# Patient Record
Sex: Female | Born: 1970 | Race: White | Hispanic: No | Smoking: Never smoker
Health system: Southern US, Community
[De-identification: ages and names within clinical notes are randomized; demographics above are authoritative.]

## PROBLEM LIST (undated history)

## (undated) DIAGNOSIS — J45909 Unspecified asthma, uncomplicated: Secondary | ICD-10-CM

## (undated) DIAGNOSIS — M1990 Unspecified osteoarthritis, unspecified site: Secondary | ICD-10-CM

## (undated) DIAGNOSIS — F32A Depression, unspecified: Secondary | ICD-10-CM

## (undated) DIAGNOSIS — I10 Essential (primary) hypertension: Secondary | ICD-10-CM

## (undated) DIAGNOSIS — I509 Heart failure, unspecified: Secondary | ICD-10-CM

## (undated) DIAGNOSIS — I499 Cardiac arrhythmia, unspecified: Secondary | ICD-10-CM

## (undated) DIAGNOSIS — E7800 Pure hypercholesterolemia, unspecified: Secondary | ICD-10-CM

## (undated) DIAGNOSIS — R569 Unspecified convulsions: Secondary | ICD-10-CM

## (undated) DIAGNOSIS — E119 Type 2 diabetes mellitus without complications: Secondary | ICD-10-CM

## (undated) DIAGNOSIS — K219 Gastro-esophageal reflux disease without esophagitis: Secondary | ICD-10-CM

## (undated) DIAGNOSIS — G4730 Sleep apnea, unspecified: Secondary | ICD-10-CM

## (undated) DIAGNOSIS — F329 Major depressive disorder, single episode, unspecified: Secondary | ICD-10-CM

## (undated) DIAGNOSIS — F419 Anxiety disorder, unspecified: Secondary | ICD-10-CM

## (undated) HISTORY — PX: ABDOMINAL SURGERY: SHX537

## (undated) HISTORY — DX: Anxiety disorder, unspecified: F41.9

## (undated) HISTORY — PX: CHOLECYSTECTOMY: SHX55

## (undated) HISTORY — DX: Cardiac arrhythmia, unspecified: I49.9

## (undated) HISTORY — DX: Type 2 diabetes mellitus without complications: E11.9

## (undated) HISTORY — PX: TYMPANOSTOMY: SHX2586

## (undated) HISTORY — PX: FOOT SURGERY: SHX648

---

## 2003-05-22 ENCOUNTER — Other Ambulatory Visit: Payer: Self-pay

## 2004-05-14 ENCOUNTER — Emergency Department: Payer: Self-pay | Admitting: Emergency Medicine

## 2004-05-15 ENCOUNTER — Emergency Department: Payer: Self-pay | Admitting: General Practice

## 2004-08-03 ENCOUNTER — Observation Stay: Payer: Self-pay | Admitting: Certified Nurse Midwife

## 2004-08-11 ENCOUNTER — Observation Stay: Payer: Self-pay

## 2004-08-19 ENCOUNTER — Observation Stay: Payer: Self-pay

## 2004-08-23 ENCOUNTER — Observation Stay: Payer: Self-pay

## 2004-08-30 ENCOUNTER — Observation Stay: Payer: Self-pay | Admitting: Obstetrics and Gynecology

## 2004-09-06 ENCOUNTER — Observation Stay: Payer: Self-pay | Admitting: Obstetrics and Gynecology

## 2004-09-13 ENCOUNTER — Observation Stay: Payer: Self-pay | Admitting: Obstetrics and Gynecology

## 2004-09-20 ENCOUNTER — Observation Stay: Payer: Self-pay | Admitting: Obstetrics and Gynecology

## 2004-09-27 ENCOUNTER — Observation Stay: Payer: Self-pay

## 2004-10-01 ENCOUNTER — Observation Stay: Payer: Self-pay | Admitting: Obstetrics and Gynecology

## 2004-10-04 ENCOUNTER — Inpatient Hospital Stay: Payer: Self-pay | Admitting: Obstetrics and Gynecology

## 2006-02-20 DIAGNOSIS — F32A Depression, unspecified: Secondary | ICD-10-CM | POA: Insufficient documentation

## 2006-08-12 ENCOUNTER — Emergency Department: Payer: Self-pay | Admitting: Emergency Medicine

## 2006-10-11 DIAGNOSIS — I10 Essential (primary) hypertension: Secondary | ICD-10-CM | POA: Insufficient documentation

## 2007-09-27 ENCOUNTER — Emergency Department: Payer: Self-pay | Admitting: Unknown Physician Specialty

## 2008-09-16 ENCOUNTER — Emergency Department: Payer: Self-pay | Admitting: Internal Medicine

## 2009-09-01 DIAGNOSIS — G8929 Other chronic pain: Secondary | ICD-10-CM | POA: Insufficient documentation

## 2009-09-01 DIAGNOSIS — G894 Chronic pain syndrome: Secondary | ICD-10-CM | POA: Insufficient documentation

## 2009-09-01 DIAGNOSIS — E669 Obesity, unspecified: Secondary | ICD-10-CM | POA: Insufficient documentation

## 2012-08-05 ENCOUNTER — Emergency Department: Payer: Self-pay | Admitting: Emergency Medicine

## 2012-08-08 LAB — BETA STREP CULTURE(ARMC)

## 2013-01-13 ENCOUNTER — Observation Stay: Payer: Self-pay | Admitting: Internal Medicine

## 2013-01-13 LAB — CBC
HCT: 36.9 % (ref 35.0–47.0)
HGB: 12.9 g/dL (ref 12.0–16.0)
MCH: 32.5 pg (ref 26.0–34.0)
MCHC: 35 g/dL (ref 32.0–36.0)
MCV: 93 fL (ref 80–100)
Platelet: 220 10*3/uL (ref 150–440)
RBC: 3.97 10*6/uL (ref 3.80–5.20)
RDW: 12.7 % (ref 11.5–14.5)
WBC: 7 10*3/uL (ref 3.6–11.0)

## 2013-01-13 LAB — TROPONIN I
Troponin-I: <0.02 ng/mL
Troponin-I: <0.02 ng/mL
Troponin-I: <0.02 ng/mL

## 2013-01-13 LAB — BASIC METABOLIC PANEL
Anion Gap: 5 — ABNORMAL LOW (ref 7–16)
BUN: 15 mg/dL (ref 7–18)
Calcium, Total: 8.5 mg/dL (ref 8.5–10.1)
Chloride: 105 mmol/L (ref 98–107)
Co2: 28 mmol/L (ref 21–32)
Creatinine: 0.86 mg/dL (ref 0.60–1.30)
EGFR (African American): >60
EGFR (Non-African Amer.): >60
Glucose: 108 mg/dL — ABNORMAL HIGH (ref 65–99)
Osmolality: 277 (ref 275–301)
Potassium: 4 mmol/L (ref 3.5–5.1)
Sodium: 138 mmol/L (ref 136–145)

## 2013-01-13 LAB — LIPID PANEL
Cholesterol: 149 mg/dL (ref 0–200)
HDL Cholesterol: 35 mg/dL — ABNORMAL LOW (ref 40–60)
Ldl Cholesterol, Calc: 82 mg/dL (ref 0–100)
Triglycerides: 162 mg/dL (ref 0–200)
VLDL Cholesterol, Calc: 32 mg/dL (ref 5–40)

## 2013-01-13 LAB — CK TOTAL AND CKMB (NOT AT ARMC)
CK, Total: 66 U/L (ref 21–215)
CK-MB: <0.5 ng/mL — ABNORMAL LOW (ref 0.5–3.6)
CK-MB: <0.5 ng/mL — ABNORMAL LOW (ref 0.5–3.6)

## 2014-03-20 DIAGNOSIS — F419 Anxiety disorder, unspecified: Secondary | ICD-10-CM | POA: Insufficient documentation

## 2014-07-04 NOTE — H&P (Signed)
PATIENT NAME:  Stacie, Shah MR#:  [id number] DATE OF BIRTH:  [birth year]  DATE OF ADMISSION:  01/13/2013  PRIMARY CARE PHYSICIAN: Dr. Christoper Fabian at [facility].   CHIEF COMPLAINT: Chest pain for 2 to 3 days.   Stacie Shah is a 44 year old morbidly obese 542 pound Caucasian female with history of congestive heart failure, possible obstructive sleep apnea, hypertension, depression, who comes to the Emergency Room with complaints of chest pain, mid substernal on and off for the last couple of days; however, continuous chest pressure for the last two days, along with some radiation to the left arm. Denies any shortness of breath, nausea, vomiting, complains of some epigastric discomfort as well. Denies any bloating, belching. In the Emergency Room she received aspirin. Her EKG shows normal sinus rhythm. First set of cardiac enzymes are negative. She is being admitted for rule out MI.   PAST MEDICAL HISTORY: 1. Congestive heart failure.  2. Depression.  3. History of migraine headaches.  4. GERD.  5. Hypertension.  6. Asthma.   The patient states she had a normal stress test about 2 to 3 years ago at [facility].   PAST SURGICAL HISTORY: 1. C-section.  2. Eye surgery.  3. Gallbladder removal.   ALLERGIES: SULFA.   MEDICATIONS: 1. Trazodone 100 mg at bedtime.  2. Tramadol 100 mg every 4 to 6 hourly.  3. Omeprazole 20 mg daily.  4. Magnesium chloride 500 mg once a day.  5. Lisinopril 40 mg daily.  6. Gabapentin 600 mg t.i.d.  7. Lasix 40 mg daily.  8. Cymbalta 60 mg daily.  9. Aspirin 81 mg daily.   SOCIAL HISTORY: Nonsmoker, unemployed. Denies any alcohol use.   FAMILY HISTORY: Positive for grandmother with CAD. Mother had COPD.   REVIEW OF SYSTEMS:  CONSTITUTIONAL: No fever, fatigue, weakness.  EYES: No blurred or double vision.  ENT: No tinnitus, ear pain, hearing loss.  RESPIRATORY: No cough, hemoptysis, respiratory distress or labored breathing.  CARDIOVASCULAR:  Positive for chest pain. Positive for hypertension. No arrhythmia or palpitations.  GASTROINTESTINAL: No nausea, vomiting, diarrhea, abdominal pain. Positive for mild discomfort with epigastric area. Positive for GERD.  GENITOURINARY: No dysuria, hematuria or frequency.  ENDOCRINE: No polyuria, nocturia or thyroid problems.  HEMATOLOGY: No anemia or easy bruising.  SKIN: No acne or rash.  MUSCULOSKELETAL: Positive for joint pain and back pain. No joint swelling.  SKIN: No rash.  ENDOCRINE: No polyuria, nocturia or thyroid problems.  HEMATOLOGY: No anemia or easy bruising or bleeding disorder.  NEUROLOGIC: No CVA. No dysarthria. No tremors or vertigo. Positive for migraine headaches.  PSYCHIATRIC: Positive for depression or bipolar disorder anxiety.   All other systems reviewed and negative.   PHYSICAL EXAMINATION: GENERAL: The patient is awake, alert, oriented x 3.  VITAL SIGNS: Afebrile, pulse is 76, blood pressure is 151/71, pulse is 98 on room air.   GENERAL: The patient is morbidly obese. HEENT: Atraumatic, normocephalic. Pupils: PERRLA, EOM intact. Oral mucosa is moist.  NECK: Supple. No JVD. No carotid bruit.  RESPIRATORY: Clear to auscultation bilaterally. No rales, rhonchi, respiratory distress or labored breathing.  CARDIOVASCULAR: Both the heart sounds are normal. Rate, rhythm regular. PMI not lateralized. Chest is nontender.  EXTREMITIES: Good pedal pulses, good femoral pulses. No lower extremity edema.  ABDOMEN: Soft, benign, nontender. No organomegaly. Positive bowel sounds.  NEUROLOGIC: Grossly intact cranial nerves II through XII. No motor or sensory deficit.  PSYCHIATRIC: The patient is awake, alert, and oriented x 3.  LABORATORY DATA: First set of cardiac enzymes negative. B-type natriuretic peptide is 2 to 3. Basic metabolic panel within normal limits. CBC within normal limits.  EKG showed normal sinus rhythm, low QRS voltage.  ASSESSMENT: A 44 year old, Stacie Shah  with history of depression, hypertension. History of heart failure, gastroesophageal reflux disease, comes in with:  1. Chest pain. The patient has chest pain for the last 2 to 3 days, more so chest pressure for the last two days constant with some radiation to the left. The patient is hemodynamically stable. Her EKG was negative. First of cardiac enzymes are negative. It appears atypical; however, she has some risk factors with her family history of heart disease, morbid obesity, her history of hypertension. We will admit patient for overnight observation, check cardiac enzymes x 3, continue her monitoring on telemetry, check lipid profile. We will  have cardiology see patient in consultation. Continue aspirin, lisinopril, we will add beta blockers.  2. History of hypertension. Continue lisinopril, add beta blockers.  3. Gastroesophageal reflux disease on omeprazole.  4. History of congestive heart failure and leg edema in the past. The patient is on Lasix at this time. We will continue with.  5. History of depression: Continue Cymbalta.  6. Deep vein thrombosis prophylaxis on subcutaneous heparin.  7. Further work-up according to the patient's clinical course. Hospital admission plan was discussed with the patient. No family members were present in the Emergency Room.   TIME SPENT: 50 minutes.    ____________________________ Hart Rochester Posey Pronto, MD sap:sg D: 01/13/2013 09:04:30 ET T: 01/13/2013 09:43:39 ET JOB#: [id number]  cc: Medina Degraffenreid A. Posey Pronto, MD, <Dictator> Dr. Christoper Fabian at [facility] MD ELECTRONICALLY SIGNED 01/17/2013 13:30

## 2014-07-04 NOTE — Consult Note (Signed)
PATIENT NAME:  Stacie Shah, FRIDDLE MR#:  [id number] DATE OF BIRTH:  07-20-1970  DATE OF CONSULTATION:  01/13/2013  REFERRING PHYSICIAN:  Dr. Christoper Fabian ([place]) / Fritzi Mandes, MD [facility]) CONSULTING PHYSICIAN:  Dwayne D. Callwood, MD  INDICATION: Chest pain.  HISTORY OF PRESENT ILLNESS: The patient is a 44 year old morbidly obese female with weight if 542 pounds who presented with history of congestive heart failure, obstructive sleep apnea, hypertension, depression, GERD, migraine headaches and asthma who started having chest pain symptoms on and off for the last few days, pressure feeling. Said she got concerned so she came to the Emergency Room. She had been seen and evaluated at [facility] a few years ago and had what sounded like a functional study which appeared to be okay. She has been treated medically since, but with this recent chest pain she came to the Emergency Room for evaluation pain. The pain she states was 10 out of 10 when she finally came, but it has resolved and improved since.   PAST MEDICAL HISTORY: Congestive heart failure, depression, migraine headaches, GERD, hypertension, asthma.   PAST SURGICAL HISTORY: C-section, eye surgery, gallbladder removal.   ALLERGIES: SULFA.   MEDICATIONS: Trazodone 100 at bedtime, tramadol 100 every 4 to 6 hours, omeprazole 20 a day, magnesium 500 once a day, lisinopril 40 a day, gabapentin 600 three times a day, Lasix 40 a day, Cymbalta 60 a day, aspirin 81 mg a day.   FAMILY HISTORY: Coronary artery disease, COPD.  SOCIAL HISTORY: Nonsmoker. Unemployed. Denies significant alcohol abuse.   REVIEW OF SYSTEMS:  Denies blackout spells or syncope. No nausea or vomiting. No fever, no chills and no sweats. No weight loss and no weight gain. No hemoptysis or hematemesis. Denies bright red blood per rectum. Recently had some chest pain. She has sleep apnea, chronic leg swelling, history of depression and morbid obesity.   PHYSICAL  EXAMINATION: VITAL SIGNS: Blood pressure 150/70, pulse 75, respiratory rate 18, afebrile.  HEENT: Normocephalic, atraumatic. Pupils equal and reactive to light.  NECK: Supple. No significant JVD, bruits, adenopathy.  LUNGS: Clear to auscultation and percussion. No significant wheeze, rhonchi or rales.  HEART: Regular rate and rhythm, distant. No significant murmur, gallops or rubs.  ABDOMEN: Benign.  EXTREMITY: Within normal limits.  NEUROLOGIC: Intact.  SKIN: Normal.   LABORATORY AND DIAGNOSTICS: Cardiac enzymes negative. CBC negative. MET-B negative.   EKG: Normal sinus rhythm, low voltage, nonspecific findings.   ASSESSMENT: 1.  Chest pain, possible angina. 2.  Morbid obesity. 3.  Gastroesophageal reflux disease. 4.  Obstructive sleep apnea. 5.  Hypertension. 6.  Depression. 7.  Asthma.   PLAN:  1.  I agree with admit. Rule out for myocardial infarction. Continue to followup enzymes. Follow up EKG. Anticoagulation short-term. If she rules out would treat the patient medically and have the patient follow up with her primary physician. If she has positive enzymes or diagnostic EKG changes, we will transfer for invasive evaluation.  2.  For hypertension, continue aggressive hypertension control with lisinopril and beta blocker. 3.  For reflux, continue omeprazole therapy. 4.  For congestive heart failure and edema, continue Lasix therapy as necessary. 5.  For obstructive sleep apnea, continue CPAP. 6.  For depression, continue Cymbalta.   Again, will consider echocardiogram. We will maintain conservative cardiology input unless she has more objective findings, either by enzymes or EKG.  ____________________________ Loran Senters Clayborn Bigness, MD ddc:sb D: 01/14/2013 10:35:09 ET T: 01/14/2013 10:56:35 ET JOB#: [id number]  cc: Karma Greaser  Prince Rome, MD, <Dictator> Yolonda Kida MD ELECTRONICALLY SIGNED 02/12/2013 21:17

## 2014-07-04 NOTE — Discharge Summary (Signed)
PATIENT NAME:  Stacie Shah, Stacie Shah MR#:  [id number] DATE OF BIRTH:  [birth year]  PRESENTING COMPLAINT: Chest pain.   DISCHARGE DIAGNOSES:  1.  Chest pain, appears atypical, likely due to anxiety, stress.  2.  Hypertension.  3.  Morbid obesity.  4.  GERD.   CONDITION ON DISCHARGE: Fair.   MEDICATIONS:  1.  Gabapentin 600 mg t.i.d.  2.  Lisinopril 40 mg daily.  3.  Lasix 40 mg daily.  4.  Cymbalta 60 mg p.o. daily.  5.  Trazodone 100 mg p.o. daily.  6.  Magnesium chloride 500 mg daily.  7.  Omeprazole 20 mg daily.  8.  Tramadol 100 mg every 6 hours as needed.  9.  Aspirin 81 mg daily over the counter.   FOLLOWUP:  1.  With Dr. Clayborn Bigness in 2 to 4 weeks.  2.  With your primary care physician as before.   DIAGNOSTIC STUDIES:  1.  Cardiac enzymes x3 negative.  2.  Echo Doppler showed EF of 55% to 60%. Normal LVEF. There is no wall-motion abnormality.  3.  EKG showed normal sinus rhythm, low QRS voltage.  4.  CBC and basic metabolic panel within normal limits.   CARDIOLOGY CONSULTATION: With Dr. Clayborn Bigness.   BRIEF SUMMARY OF HOSPITAL COURSE: Shinika Estelle is a 44 year old obese Caucasian female with history of depression, hypertension, and history of heart failure, who comes in with:  1.  Chest pain. She was admitted on medical floor where she was ruled out with 3 sets of negative cardiac enzymes, normal EKG, and normal echo. The patient's chest pain appears atypical, likely due to stress, with significant psychosocial stressors at home. She has continued on aspirin, lisinopril. I spoke with Dr. Clayborn Bigness, agrees with medical management. The patient's weight and limited ability to do further cardiac testing, although she does not require it at this time. The patient is recommended to go to tertiary care center for further work-up in the event that she experiences those symptoms back.  2.  Hypertension. Continue lisinopril.  3.  GERD, on omeprazole.  4.  History of CHF. The patient is on Lasix  which is resumed.  5.  History of depression and anxiety. Continue Cymbalta.   Hospital stay otherwise remained stable.   CODE STATUS: REMAINED A FULL CODE.   TIME SPENT: 40 minutes.   ____________________________ Hart Rochester Posey Pronto, MD sap:np D: 01/14/2013 15:11:47 ET T: 01/14/2013 16:37:09 ET JOB#: [id number]  cc: Natalio Salois A. Posey Pronto, MD, <Dictator> Ilda Basset MD ELECTRONICALLY SIGNED 01/17/2013 13:31

## 2014-12-11 ENCOUNTER — Emergency Department: Payer: Medicaid Other

## 2014-12-11 ENCOUNTER — Encounter: Payer: Self-pay | Admitting: Emergency Medicine

## 2014-12-11 ENCOUNTER — Emergency Department
Admission: EM | Admit: 2014-12-11 | Discharge: 2014-12-11 | Disposition: A | Discharge status: Home / Self Care | Payer: Medicaid Other | Attending: Emergency Medicine | Admitting: Emergency Medicine

## 2014-12-11 DIAGNOSIS — M1712 Unilateral primary osteoarthritis, left knee: Secondary | ICD-10-CM | POA: Insufficient documentation

## 2014-12-11 DIAGNOSIS — M79605 Pain in left leg: Secondary | ICD-10-CM | POA: Diagnosis present

## 2014-12-11 DIAGNOSIS — Z720 Tobacco use: Secondary | ICD-10-CM | POA: Diagnosis not present

## 2014-12-11 DIAGNOSIS — I10 Essential (primary) hypertension: Secondary | ICD-10-CM | POA: Insufficient documentation

## 2014-12-11 HISTORY — DX: Essential (primary) hypertension: I10

## 2014-12-11 HISTORY — DX: Major depressive disorder, single episode, unspecified: F32.9

## 2014-12-11 HISTORY — DX: Depression, unspecified: F32.A

## 2014-12-11 MED ORDER — NAPROXEN 500 MG PO TABS: 500.0000 mg | ORAL_TABLET | Freq: Two times a day (BID) | ORAL | Status: DC

## 2014-12-11 NOTE — ED Provider Notes (Signed)
[facility] Emergency Department Provider Note  ____________________________________________  Time seen: Approximately 8:53 AM  I have reviewed the triage vital signs and the nursing notes.   HISTORY  Chief Complaint Leg Pain   HPI Stacie Shah is a 44 y.o. female is here with complaint of left leg pain since Monday. She states she has not had any injury. Pain increases with weightbearing and ambulation. She states that the same thing happen to her right leg approximately one year ago. She states she did not see her PCP and it went away. She has not taken any over-the-counter medication.When describing her pain and she points to her anterior knee and rates it a 9/10. She denies any shortness of breath or fever.     Past Medical History  Diagnosis Date  . Hypertension   . Depression     There are no active problems to display for this patient.   Past Surgical History  Procedure Laterality Date  . Cholecystectomy      Current Outpatient Rx  Name  Route  Sig  Dispense  Refill  . naproxen (NAPROSYN) 500 MG tablet   Oral   Take 1 tablet (500 mg total) by mouth 2 (two) times daily with a meal.   30 tablet   0     Allergies Sulfa antibiotics  No family history on file.  Social History Social History  Substance Use Topics  . Smoking status: Current Every Day Smoker  . Smokeless tobacco: None  . Alcohol Use: Yes    Review of Systems Constitutional: No fever/chills ENT: No sore throat. Cardiovascular: Denies chest pain. Respiratory: Denies shortness of breath. Gastrointestinal: No nausea, no vomiting.  No diarrhea.  No constipation. Genitourinary: Negative for dysuria. Musculoskeletal: Negative for back pain. Positive left knee pain Skin: Negative for rash. Neurological: Negative for headaches, focal weakness or numbness.  10-point ROS otherwise negative.  ____________________________________________   PHYSICAL EXAM:  VITAL  SIGNS: ED Triage Vitals  Enc Vitals Group     BP --      Pulse --      Resp --      Temp --      Temp src --      SpO2 --      Weight --      Height --      Head Cir --      Peak Flow --      Pain Score 12/11/14 0852 9     Pain Loc --      Pain Edu? --      Excl. in [place]? --     Constitutional: Alert and oriented. Well appearing and in no acute distress. Morbidly obese Eyes: Conjunctivae are normal. PERRL. EOMI. Head: Atraumatic. Nose: No congestion/rhinnorhea. Neck: No stridor.   Cardiovascular: Normal rate, regular rhythm. Grossly normal heart sounds.  Good peripheral circulation. Respiratory: Normal respiratory effort.  No retractions. Lungs CTAB. Gastrointestinal: Soft and nontender. No distention. Musculoskeletal: Left knee anatomy is slightly difficult to palpate secondary to patient's severe obesity. Range of motion is limited secondary to pain,  there was no actual crepitus noted. There is no edema or erythema noted. Pulses positive and motor sensory function intact. Neurologic:  Normal speech and language. No gross focal neurologic deficits are appreciated. No gait instability. Skin:  Skin is warm, dry and intact. No rash noted. No abrasions, erythema or ecchymosis noted Psychiatric: Mood and affect are normal. Speech and behavior are normal.  ____________________________________________  LABS (all labs ordered are listed, but only abnormal results are displayed)  Labs Reviewed - No data to display RADIOLOGY  Osteoarthritic changes seen on left knee, most markedly medial aspect. I, Johnn Hai, personally viewed and evaluated these images (plain radiographs) as part of my medical decision making.   PROCEDURES  Procedure(s) performed: None  Critical Care performed: No   INITIAL IMPRESSION / ASSESSMENT AND PLAN / ED COURSE  Pertinent labs & imaging results that were available during my care of the patient were reviewed by me and considered in my medical  decision making (see chart for details).  _Patient was started on naproxen 500 mg twice a day with food. She is to follow-up with her doctor at [facility] if any continued problems._   ____________________________________________   FINAL CLINICAL IMPRESSION(S) / ED DIAGNOSES  Final diagnoses:  Primary osteoarthritis of left knee     Johnn Hai, PA-C 12/11/14 [place], MD 12/11/14 1212

## 2014-12-11 NOTE — ED Notes (Signed)
Developed left leg pain on Monday w/o injury.pain increases with ambulation

## 2014-12-11 NOTE — Discharge Instructions (Signed)
Arthritis, Nonspecific °Arthritis is pain, redness, warmth, or puffiness (inflammation) of a joint. The joint may be stiff or hurt when you move it. One or more joints may be affected. There are many types of arthritis. Your doctor may not know what type you have right away. The most common cause of arthritis is wear and tear on the joint (osteoarthritis). °HOME CARE  °· Only take medicine as told by your doctor. °· Rest the joint as much as possible. °· Raise (elevate) your joint if it is puffy. °· Use crutches if the painful joint is in your leg. °· Drink enough fluids to keep your pee (urine) clear or pale yellow. °· Follow your doctor's diet instructions. °· Use cold packs for very bad joint pain for 10 to 15 minutes every hour. Ask your doctor if it is okay for you to use hot packs. °· Exercise as told by your doctor. °· Take a warm shower if you have stiffness in the morning. °· Move your sore joints throughout the day. °GET HELP RIGHT AWAY IF:  °· You have a fever. °· You have very bad joint pain, puffiness, or redness. °· You have many joints that are painful and puffy. °· You are not getting better with treatment. °· You have very bad back pain or leg weakness. °· You cannot control when you poop (bowel movement) or pee (urinate). °· You do not feel better in 24 hours or are getting worse. °· You are having side effects from your medicine. °MAKE SURE YOU:  °· Understand these instructions. °· Will watch your condition. °· Will get help right away if you are not doing well or get worse. °Document Released: 05/25/2009 Document Revised: 08/30/2011 Document Reviewed: 05/25/2009 °[employer]® Patient Information ©2015 [employer]. This information is not intended to replace advice given to you by your health care provider. Make sure you discuss any questions you have with your health care provider. ° °

## 2015-11-04 ENCOUNTER — Encounter (HOSPITAL_COMMUNITY): Payer: Self-pay | Admitting: Emergency Medicine

## 2015-11-04 ENCOUNTER — Emergency Department (HOSPITAL_COMMUNITY)
Admission: EM | Admit: 2015-11-04 | Discharge: 2015-11-04 | Disposition: A | Discharge status: Home / Self Care | Payer: Medicaid Other | Attending: Emergency Medicine | Admitting: Emergency Medicine

## 2015-11-04 DIAGNOSIS — I10 Essential (primary) hypertension: Secondary | ICD-10-CM | POA: Insufficient documentation

## 2015-11-04 DIAGNOSIS — Z79899 Other long term (current) drug therapy: Secondary | ICD-10-CM | POA: Diagnosis not present

## 2015-11-04 DIAGNOSIS — Z7982 Long term (current) use of aspirin: Secondary | ICD-10-CM | POA: Insufficient documentation

## 2015-11-04 DIAGNOSIS — H6092 Unspecified otitis externa, left ear: Secondary | ICD-10-CM | POA: Diagnosis not present

## 2015-11-04 DIAGNOSIS — H9202 Otalgia, left ear: Secondary | ICD-10-CM | POA: Diagnosis present

## 2015-11-04 MED ORDER — NEOMYCIN-POLYMYXIN-HC 3.5-10000-1 OT SUSP: 4.0000 [drp] | Freq: Three times a day (TID) | OTIC | 0 refills | Status: DC

## 2015-11-04 MED ORDER — AMOXICILLIN 500 MG PO CAPS: 500.0000 mg | ORAL_CAPSULE | Freq: Three times a day (TID) | ORAL | 0 refills | Status: DC

## 2015-11-04 NOTE — ED Triage Notes (Signed)
Pt c/o otalgia to LT ear. Pt also reports dizziness and HA. States she went swimming over the weekend.

## 2015-11-04 NOTE — Discharge Instructions (Signed)
Follow-up with your doctor or return here for any worsening symptoms °

## 2015-11-05 NOTE — ED Provider Notes (Signed)
[facility] DEPT Provider Note   CSN: [id number] Arrival date & time: 11/04/15  1150     History   Chief Complaint Chief Complaint  Patient presents with  . Otalgia    HPI Stacie Shah is a 45 y.o. female.  HPI   Stacie Shah is a 45 y.o. female who presents to the Emergency Department complaining of left ear pain, facial pain and intermittent dizziness.  She reports symptoms for several days and began after swimming.  symptoms are associated with hearing loss to the left ear.  She has tried OTC pain medications without relief.  Pain is constant, nothing makes it better or worse,  She denies fever, vomiting, neck pain, chest pain of ear drainage.    Past Medical History:  Diagnosis Date  . Depression   . Hypertension     There are no active problems to display for this patient.   Past Surgical History:  Procedure Laterality Date  . CHOLECYSTECTOMY    . TYMPANOSTOMY      OB History    No data available       Home Medications    Prior to Admission medications   Medication Sig Start Date End Date Taking? Authorizing Provider  aspirin EC 81 MG tablet Take 81 mg by mouth daily. 08/23/10  Yes Historical Provider, MD  DULoxetine (CYMBALTA) 60 MG capsule Take 120 mg by mouth daily. 08/31/15  Yes Historical Provider, MD  furosemide (LASIX) 40 MG tablet Take 60 mg by mouth daily. 08/31/15  Yes Historical Provider, MD  hydrOXYzine (ATARAX/VISTARIL) 25 MG tablet Take 25 mg by mouth every 8 (eight) hours as needed for anxiety.   Yes Historical Provider, MD  lisinopril (PRINIVIL,ZESTRIL) 40 MG tablet Take 40 mg by mouth daily. 08/31/15  Yes Historical Provider, MD  magnesium oxide (MAG-OX) 400 MG tablet Take 400 mg by mouth daily. 07/27/10  Yes Historical Provider, MD  omeprazole (PRILOSEC) 20 MG capsule Take 20 mg by mouth daily. 08/31/15  Yes Historical Provider, MD  traMADol (ULTRAM) 50 MG tablet Take 50 mg by mouth every 8 (eight) hours as needed. 08/31/15  Yes  Historical Provider, MD  traZODone (DESYREL) 100 MG tablet Take 100 mg by mouth at bedtime. 08/31/15  Yes Historical Provider, MD  amoxicillin (AMOXIL) 500 MG capsule Take 1 capsule (500 mg total) by mouth 3 (three) times daily. 11/04/15   Donshay Lupinski, PA-C  naproxen (NAPROSYN) 500 MG tablet Take 1 tablet (500 mg total) by mouth 2 (two) times daily with a meal. Patient not taking: Reported on 11/04/2015 12/11/14   Johnn Hai, PA-C  neomycin-polymyxin-hydrocortisone (CORTISPORIN) 3.5-10000-1 otic suspension Place 4 drops into the left ear 3 (three) times daily. 11/04/15   Otis Burress, PA-C    Family History No family history on file.  Social History Social History  Substance Use Topics  . Smoking status: Never Smoker  . Smokeless tobacco: Never Used  . Alcohol use Yes     Comment: socially      Allergies   Sulfa antibiotics   Review of Systems Review of Systems  Constitutional: Negative for activity change, appetite change, chills and fever.  HENT: Positive for ear pain (left ear pain) and hearing loss. Negative for congestion, ear discharge, facial swelling, rhinorrhea, sore throat and trouble swallowing.   Eyes: Negative for visual disturbance.  Respiratory: Negative for cough, shortness of breath, wheezing and stridor.   Gastrointestinal: Negative for nausea and vomiting.  Musculoskeletal: Negative for neck pain and neck  stiffness.  Skin: Negative.   Neurological: Positive for dizziness. Negative for syncope, weakness, numbness and headaches.  Hematological: Negative for adenopathy.  Psychiatric/Behavioral: Negative for confusion.  All other systems reviewed and are negative.    Physical Exam Updated Vital Signs BP 153/89 (BP Location: Left Arm)   Pulse 87   Temp 98.1 F (36.7 C) (Oral)   Resp 20   Ht 5\' 5"  (1.651 m)   Wt (!) 190.5 kg   LMP 10/17/2015   SpO2 97%   BMI 69.89 kg/m   Physical Exam  Constitutional: She is oriented to person, place, and  time. She appears well-developed and well-nourished. No distress.  HENT:  Head: Normocephalic and atraumatic.  Mouth/Throat: Uvula is midline, oropharynx is clear and moist and mucous membranes are normal. No uvula swelling. No oropharyngeal exudate.  Erythema of the left TM.  Mild middle ear effusion present.  Mild purulent drainage of the ear canal, no edema. TM appears intact.    Neck: Normal range of motion. Neck supple.  Cardiovascular: Normal rate, regular rhythm and intact distal pulses.   No murmur heard. Pulmonary/Chest: Effort normal and breath sounds normal. No stridor. No respiratory distress.  Lymphadenopathy:    She has no cervical adenopathy.  Neurological: She is alert and oriented to person, place, and time. Coordination normal.  Skin: Skin is warm and dry. No rash noted.  Nursing note and vitals reviewed.    ED Treatments / Results  Labs (all labs ordered are listed, but only abnormal results are displayed) Labs Reviewed - No data to display  EKG  EKG Interpretation None       Radiology No results found.  Procedures Procedures (including critical care time)  Medications Ordered in ED Medications - No data to display   Initial Impression / Assessment and Plan / ED Course  I have reviewed the triage vital signs and the nursing notes.  Pertinent labs & imaging results that were available during my care of the patient were reviewed by me and considered in my medical decision making (see chart for details).  Clinical Course    Pt non-toxic appearing.  OM with OE.  Pt agrees to amoxil and cortisporin otic.  She agrees to PMD f/u or ER return if needed.  Final Clinical Impressions(s) / ED Diagnoses   Final diagnoses:  Otitis externa, left    New Prescriptions Discharge Medication List as of 11/04/2015 12:46 PM    START taking these medications   Details  amoxicillin (AMOXIL) 500 MG capsule Take 1 capsule (500 mg total) by mouth 3 (three) times  daily., Starting Wed 11/04/2015, Print    neomycin-polymyxin-hydrocortisone (CORTISPORIN) 3.5-10000-1 otic suspension Place 4 drops into the left ear 3 (three) times daily., Starting Wed 11/04/2015, Print         Addalyne Vandehei [place], PA-C 11/05/15 2138    Merrily Pew, MD 11/07/15 [phone]

## 2015-11-23 ENCOUNTER — Emergency Department (HOSPITAL_COMMUNITY): Payer: Medicaid Other

## 2015-11-23 ENCOUNTER — Emergency Department (HOSPITAL_COMMUNITY)
Admission: EM | Admit: 2015-11-23 | Discharge: 2015-11-23 | Disposition: A | Discharge status: Home / Self Care | Payer: Medicaid Other | Attending: Emergency Medicine | Admitting: Emergency Medicine

## 2015-11-23 ENCOUNTER — Encounter (HOSPITAL_COMMUNITY): Payer: Self-pay | Admitting: Emergency Medicine

## 2015-11-23 DIAGNOSIS — M79671 Pain in right foot: Secondary | ICD-10-CM | POA: Diagnosis not present

## 2015-11-23 DIAGNOSIS — I10 Essential (primary) hypertension: Secondary | ICD-10-CM | POA: Diagnosis not present

## 2015-11-23 DIAGNOSIS — Z79899 Other long term (current) drug therapy: Secondary | ICD-10-CM | POA: Diagnosis not present

## 2015-11-23 DIAGNOSIS — Z792 Long term (current) use of antibiotics: Secondary | ICD-10-CM | POA: Diagnosis not present

## 2015-11-23 DIAGNOSIS — Z7982 Long term (current) use of aspirin: Secondary | ICD-10-CM | POA: Insufficient documentation

## 2015-11-23 MED ORDER — IBUPROFEN 800 MG PO TABS
800.0000 mg | ORAL_TABLET | Freq: Once | ORAL | Status: AC
  Administered 2015-11-23: 800 mg via ORAL
  Filled 2015-11-23: qty 1

## 2015-11-23 MED ORDER — NAPROXEN 500 MG PO TABS: 500.0000 mg | ORAL_TABLET | Freq: Two times a day (BID) | ORAL | 0 refills | Status: DC

## 2015-11-23 NOTE — ED Provider Notes (Signed)
[facility] DEPT Provider Note   CSN: [id number] Arrival date & time: 11/23/15  0753  By signing my name below, I, Higinio Plan, attest that this documentation has been prepared under the direction and in the presence of Ezequiel Essex, MD . Electronically Signed: Higinio Plan, Scribe. 11/23/2015. 9:24 AM.  History   Chief Complaint Chief Complaint  Patient presents with  . Foot Pain   The history is provided by the patient. No language interpreter was used.   HPI Comments: Stacie Shah is a 45 y.o. female with PMHx of HTN and depression, who presents to the Emergency Department complaining of gradually worsening, right heel pain that began 1 week ago. Pt reports her pain is exacerbated when bearing weight or putting pressure on it. She states she normally wears flip-flops or sandals. She notes she has not taken any medication to relieve her pain. She denies chest pain, shortness of breath, fever, back pain, numbness in her BLE and hx of DM. Pt denies recent injury or fall. She reports PSHx to her right heel to remove a cyst 2 years ago. She states her last normal menstrual period ended yesterday.   PCP: [facility]   Past Medical History:  Diagnosis Date  . Depression   . Hypertension    There are no active problems to display for this patient.  Past Surgical History:  Procedure Laterality Date  . CHOLECYSTECTOMY    . FOOT SURGERY     cyst removal from heel  . TYMPANOSTOMY      OB History    No data available     Home Medications    Prior to Admission medications   Medication Sig Start Date End Date Taking? Authorizing Provider  amoxicillin (AMOXIL) 500 MG capsule Take 1 capsule (500 mg total) by mouth 3 (three) times daily. 11/04/15   Tammy Triplett, PA-C  aspirin EC 81 MG tablet Take 81 mg by mouth daily. 08/23/10   Historical Provider, MD  DULoxetine (CYMBALTA) 60 MG capsule Take 120 mg by mouth daily. 08/31/15   Historical Provider, MD  furosemide  (LASIX) 40 MG tablet Take 60 mg by mouth daily. 08/31/15   Historical Provider, MD  hydrOXYzine (ATARAX/VISTARIL) 25 MG tablet Take 25 mg by mouth every 8 (eight) hours as needed for anxiety.    Historical Provider, MD  lisinopril (PRINIVIL,ZESTRIL) 40 MG tablet Take 40 mg by mouth daily. 08/31/15   Historical Provider, MD  magnesium oxide (MAG-OX) 400 MG tablet Take 400 mg by mouth daily. 07/27/10   Historical Provider, MD  naproxen (NAPROSYN) 500 MG tablet Take 1 tablet (500 mg total) by mouth 2 (two) times daily. 11/23/15   Ezequiel Essex, MD  neomycin-polymyxin-hydrocortisone (CORTISPORIN) 3.5-10000-1 otic suspension Place 4 drops into the left ear 3 (three) times daily. 11/04/15   Tammy Triplett, PA-C  omeprazole (PRILOSEC) 20 MG capsule Take 20 mg by mouth daily. 08/31/15   Historical Provider, MD  traMADol (ULTRAM) 50 MG tablet Take 50 mg by mouth every 8 (eight) hours as needed. 08/31/15   Historical Provider, MD  traZODone (DESYREL) 100 MG tablet Take 100 mg by mouth at bedtime. 08/31/15   Historical Provider, MD    Family History History reviewed. No pertinent family history.  Social History Social History  Substance Use Topics  . Smoking status: Never Smoker  . Smokeless tobacco: Never Used  . Alcohol use Yes     Comment: socially      Allergies   Sulfa antibiotics  Review of Systems Review of Systems 10 systems reviewed and all are negative for acute change except as noted in the HPI.  Physical Exam Updated Vital Signs BP 120/68 (BP Location: Left Arm)   Pulse 93   Temp 98.9 F (37.2 C) (Oral)   Resp 18   LMP 11/18/2015 (Exact Date)   SpO2 98%   Physical Exam  Constitutional: She is oriented to person, place, and time. She appears well-developed and well-nourished. No distress.  moribdly obese  HENT:  Head: Normocephalic and atraumatic.  Mouth/Throat: Oropharynx is clear and moist. No oropharyngeal exudate.  Eyes: Conjunctivae and EOM are normal. Pupils are  equal, round, and reactive to light.  Neck: Normal range of motion. Neck supple.  No meningismus.  Cardiovascular: Normal rate, regular rhythm, normal heart sounds and intact distal pulses.   No murmur heard. Pulmonary/Chest: Effort normal and breath sounds normal. No respiratory distress.  Abdominal: Soft. There is no tenderness. There is no rebound and no guarding.  Musculoskeletal: Normal range of motion. She exhibits no edema or tenderness.  Tenderness to right heel, intact DP and TP pulses, intact achilles   Neurological: She is alert and oriented to person, place, and time. No cranial nerve deficit. She exhibits normal muscle tone. Coordination normal.  No ataxia on finger to nose bilaterally. No pronator drift. 5/5 strength throughout. CN 2-12 intact.Equal grip strength. Sensation intact.   Skin: Skin is warm.  Psychiatric: She has a normal mood and affect. Her behavior is normal.  Nursing note and vitals reviewed.  ED Treatments / Results  Labs (all labs ordered are listed, but only abnormal results are displayed) Labs Reviewed - No data to display  EKG  EKG Interpretation None       Radiology Dg Os Calcis Right  Result Date: 11/23/2015 CLINICAL DATA:  Right heel pain EXAM: RIGHT OS CALCIS - 2+ VIEW COMPARISON:  None. FINDINGS: No fracture or dislocation is seen. Small plantar calcaneal enthesophyte. Visualized soft tissues are within normal limits. IMPRESSION: Small plantar calcaneal enthesophyte. Electronically Signed   By: Julian Hy M.D.   On: 11/23/2015 08:50   Procedures Procedures   Medications Ordered in ED Medications  ibuprofen (ADVIL,MOTRIN) tablet 800 mg (800 mg Oral Given 11/23/15 [id number])     Initial Impression / Assessment and Plan / ED Course  I have reviewed the triage vital signs and the nursing notes.  Pertinent labs & imaging results that were available during my care of the patient were reviewed by me and considered in my medical decision  making (see chart for details).  Clinical Course  DIAGNOSTIC STUDIES:  Oxygen Saturation is 98% on RA, normal by my interpretation.    COORDINATION OF CARE:  8:16 AM Discussed treatment plan with pt at bedside and pt agreed to plan.  Morbidly obese patient with one week of atraumatic right heel pain. No fevers or vomiting.  Neurovascularly intact. Achilles intact. X-ray shows possible enthesophyte.  Patient will be given a hard soled shoe, anti-inflammatories, follow-up with her orthopedic surgeon in [facility]. Return precautions discussed.  I personally performed the services described in this documentation, which was scribed in my presence. The recorded information has been reviewed and is accurate.   Final Clinical Impressions(s) / ED Diagnoses   Final diagnoses:  Heel pain, right    New Prescriptions New Prescriptions   NAPROXEN (NAPROSYN) 500 MG TABLET    Take 1 tablet (500 mg total) by mouth 2 (two) times daily.  Ezequiel Essex, MD 11/23/15 [phone]

## 2015-11-23 NOTE — ED Triage Notes (Signed)
Pt reports right heel pain x1 week.  Pt had heel surgery to remove cyst two years ago.  Pt ambulatory, no deformities at this time.

## 2016-01-05 ENCOUNTER — Emergency Department
Admission: EM | Admit: 2016-01-05 | Discharge: 2016-01-05 | Disposition: A | Discharge status: Home / Self Care | Payer: Medicaid Other | Attending: Emergency Medicine | Admitting: Emergency Medicine

## 2016-01-05 ENCOUNTER — Emergency Department: Payer: Medicaid Other

## 2016-01-05 DIAGNOSIS — M722 Plantar fascial fibromatosis: Secondary | ICD-10-CM | POA: Diagnosis not present

## 2016-01-05 DIAGNOSIS — Z791 Long term (current) use of non-steroidal anti-inflammatories (NSAID): Secondary | ICD-10-CM | POA: Diagnosis not present

## 2016-01-05 DIAGNOSIS — I509 Heart failure, unspecified: Secondary | ICD-10-CM | POA: Diagnosis not present

## 2016-01-05 DIAGNOSIS — I110 Hypertensive heart disease with heart failure: Secondary | ICD-10-CM | POA: Diagnosis not present

## 2016-01-05 DIAGNOSIS — Z79899 Other long term (current) drug therapy: Secondary | ICD-10-CM | POA: Diagnosis not present

## 2016-01-05 DIAGNOSIS — M79671 Pain in right foot: Secondary | ICD-10-CM | POA: Diagnosis present

## 2016-01-05 DIAGNOSIS — Z7982 Long term (current) use of aspirin: Secondary | ICD-10-CM | POA: Diagnosis not present

## 2016-01-05 HISTORY — DX: Heart failure, unspecified: I50.9

## 2016-01-05 MED ORDER — MELOXICAM 7.5 MG PO TABS
15.0000 mg | ORAL_TABLET | Freq: Once | ORAL | Status: AC
  Administered 2016-01-05: 15 mg via ORAL
  Filled 2016-01-05: qty 2

## 2016-01-05 MED ORDER — MELOXICAM 15 MG PO TABS
15.0000 mg | ORAL_TABLET | Freq: Every day | ORAL | 0 refills | Status: DC
Start: 2016-01-05 — End: 2017-05-06

## 2016-01-05 NOTE — ED Notes (Signed)
See triage..the patient c/o R heel pain.  Denies injury, motor function, sensation and circulation intact.

## 2016-01-05 NOTE — ED Triage Notes (Signed)
Pt presents to ED with c/o RIGHT heel pain that started around noon today. Pt reports previous h/x of bone spur and plantar fasciitis in the same foot. Pt denies any new or known injury or trauma. CMS intact, toes on affected extremity are warm, pink and dry.

## 2016-01-05 NOTE — ED Provider Notes (Signed)
[facility] Emergency Department Provider Note  ____________________________________________  Time seen: Approximately 8:45 PM  I have reviewed the triage vital signs and the nursing notes.   HISTORY  Chief Complaint Foot Pain     HPI Stacie Shah is a 45 y.o. female who presents emergency department complaining of sharp pain to the right foot. Patient states that she does have a known bone spur in his had plantar fasciitis in the past. No new injury. She states the pain is sharp, constant, worse with ambulation. No medications prior to arrival. Patient denies any numbness or tingling in her toes.Patient denies any wounds to her foot.   Past Medical History:  Diagnosis Date  . CHF (congestive heart failure) (Rule)   . Depression   . Hypertension     There are no active problems to display for this patient.   Past Surgical History:  Procedure Laterality Date  . CHOLECYSTECTOMY    . FOOT SURGERY     cyst removal from heel  . REPEAT CESAREAN SECTION    . TYMPANOSTOMY      Prior to Admission medications   Medication Sig Start Date End Date Taking? Authorizing Provider  amoxicillin (AMOXIL) 500 MG capsule Take 1 capsule (500 mg total) by mouth 3 (three) times daily. 11/04/15   Tammy Triplett, PA-C  aspirin EC 81 MG tablet Take 81 mg by mouth daily. 08/23/10   Historical Provider, MD  DULoxetine (CYMBALTA) 60 MG capsule Take 120 mg by mouth daily. 08/31/15   Historical Provider, MD  furosemide (LASIX) 40 MG tablet Take 60 mg by mouth daily. 08/31/15   Historical Provider, MD  hydrOXYzine (ATARAX/VISTARIL) 25 MG tablet Take 25 mg by mouth every 8 (eight) hours as needed for anxiety.    Historical Provider, MD  lisinopril (PRINIVIL,ZESTRIL) 40 MG tablet Take 40 mg by mouth daily. 08/31/15   Historical Provider, MD  magnesium oxide (MAG-OX) 400 MG tablet Take 400 mg by mouth daily. 07/27/10   Historical Provider, MD  meloxicam (MOBIC) 15 MG tablet Take 1  tablet (15 mg total) by mouth daily. 01/05/16   Charline Bills Cuthriell, PA-C  naproxen (NAPROSYN) 500 MG tablet Take 1 tablet (500 mg total) by mouth 2 (two) times daily. 11/23/15   Ezequiel Essex, MD  neomycin-polymyxin-hydrocortisone (CORTISPORIN) 3.5-10000-1 otic suspension Place 4 drops into the left ear 3 (three) times daily. 11/04/15   Tammy Triplett, PA-C  omeprazole (PRILOSEC) 20 MG capsule Take 20 mg by mouth daily. 08/31/15   Historical Provider, MD  traMADol (ULTRAM) 50 MG tablet Take 50 mg by mouth every 8 (eight) hours as needed. 08/31/15   Historical Provider, MD  traZODone (DESYREL) 100 MG tablet Take 100 mg by mouth at bedtime. 08/31/15   Historical Provider, MD    Allergies Sulfa antibiotics  No family history on file.  Social History Social History  Substance Use Topics  . Smoking status: Never Smoker  . Smokeless tobacco: Never Used  . Alcohol use Yes     Comment: socially      Review of Systems  Constitutional: No fever/chills Eyes: No visual changes. No discharge ENT: No upper respiratory complaints. Cardiovascular: no chest pain. Respiratory: no cough. No SOB. Gastrointestinal: No abdominal pain.  No nausea, no vomiting.  No diarrhea.  No constipation. Musculoskeletal: Positive for right foot pain Skin: Negative for rash, abrasions, lacerations, ecchymosis. Neurological: Negative for headaches, focal weakness or numbness. 10-point ROS otherwise negative.  ____________________________________________   PHYSICAL EXAM:  VITAL SIGNS:  ED Triage Vitals  Enc Vitals Group     BP 01/05/16 2013 (!) 156/86     Pulse Rate 01/05/16 2013 80     Resp 01/05/16 2013 16     Temp 01/05/16 2013 98.6 F (37 C)     Temp Source 01/05/16 2013 Oral     SpO2 01/05/16 2013 97 %     Weight 01/05/16 2013 (!) 420 lb (190.5 kg)     Height 01/05/16 2013 5\' 5"  (1.651 m)     Head Circumference --      Peak Flow --      Pain Score 01/05/16 2014 10     Pain Loc --      Pain Edu?  --      Excl. in [place]? --      Constitutional: Alert and oriented. Well appearing and in no acute distress. Eyes: Conjunctivae are normal. PERRL. EOMI. Head: Atraumatic. Neck: No stridor.    Cardiovascular: Normal rate, regular rhythm. Normal S1 and S2.  Good peripheral circulation. Respiratory: Normal respiratory effort without tachypnea or retractions. Lungs CTAB. Good air entry to the bases with no decreased or absent breath sounds. Musculoskeletal: Full range of motion to all extremities. No gross deformities appreciated. No deformities or edema noted to the right foot upon inspection. Patient is tender to palpation over the plantar aspect greater over the calcaneus. No palpable abnormality. Full range of motion to foot and ankle. Sensation and cap refill intact 5 digits. Neurologic:  Normal speech and language. No gross focal neurologic deficits are appreciated.  Skin:  Skin is warm, dry and intact. No rash noted. Psychiatric: Mood and affect are normal. Speech and behavior are normal. Patient exhibits appropriate insight and judgement.   ____________________________________________   LABS (all labs ordered are listed, but only abnormal results are displayed)  Labs Reviewed - No data to display ____________________________________________  EKG   ____________________________________________  RADIOLOGY Diamantina Providence Cuthriell, personally viewed and evaluated these images (plain radiographs) as part of my medical decision making, as well as reviewing the written report by the radiologist.  Dg Foot Complete Right  Result Date: 01/05/2016 CLINICAL DATA:  Right heel pain beginning at noon today. No known injury. Initial encounter. EXAM: RIGHT FOOT COMPLETE - 3+ VIEW COMPARISON:  Plain films of the right heel 11/23/2015. FINDINGS: No acute bony or joint abnormality is identified. Tiny plantar calcaneal spur is unchanged. Mild midfoot osteoarthritis is noted. Soft tissues are  unremarkable. IMPRESSION: Tiny plantar calcaneal spur.  No acute abnormality. Electronically Signed   By: Inge Rise M.D.   On: 01/05/2016 21:16    ____________________________________________    PROCEDURES  Procedure(s) performed:    Procedures    Medications  meloxicam (MOBIC) tablet 15 mg (not administered)     ____________________________________________   INITIAL IMPRESSION / ASSESSMENT AND PLAN / ED COURSE  Pertinent labs & imaging results that were available during my care of the patient were reviewed by me and considered in my medical decision making (see chart for details).  Review of the [place] CSRS was performed in accordance of the [place] prior to dispensing any controlled drugs.  Clinical Course    Patient's diagnosis is consistent with plantar fasciitis. X-ray reveals small calcaneal spur. Symptoms are consistent with plantar fasciitis. Patient will be started on anti-inflammatories she will follow-up with orthopedics or podiatry for further evaluation should symptoms persist..  Patient is given ED precautions to return to the ED for any worsening or new symptoms.  ____________________________________________  FINAL CLINICAL IMPRESSION(S) / ED DIAGNOSES  Final diagnoses:  Plantar fasciitis of right foot      NEW MEDICATIONS STARTED DURING THIS VISIT:  New Prescriptions   MELOXICAM (MOBIC) 15 MG TABLET    Take 1 tablet (15 mg total) by mouth daily.        This chart was dictated using voice recognition software/Dragon. Despite best efforts to proofread, errors can occur which can change the meaning. Any change was purely unintentional.    Darletta Moll, PA-C 01/05/16 2132    Drenda Freeze, MD 01/05/16 2329

## 2016-06-28 DIAGNOSIS — I10 Essential (primary) hypertension: Secondary | ICD-10-CM | POA: Insufficient documentation

## 2016-06-28 DIAGNOSIS — I5032 Chronic diastolic (congestive) heart failure: Secondary | ICD-10-CM | POA: Insufficient documentation

## 2016-07-20 ENCOUNTER — Other Ambulatory Visit: Payer: Self-pay | Admitting: Medical Oncology

## 2016-07-30 ENCOUNTER — Emergency Department: Payer: Medicaid Other

## 2016-07-30 ENCOUNTER — Emergency Department
Admission: EM | Admit: 2016-07-30 | Discharge: 2016-07-30 | Disposition: A | Discharge status: Home / Self Care | Payer: Medicaid Other | Attending: Emergency Medicine | Admitting: Emergency Medicine

## 2016-07-30 DIAGNOSIS — Z79899 Other long term (current) drug therapy: Secondary | ICD-10-CM | POA: Insufficient documentation

## 2016-07-30 DIAGNOSIS — X501XXA Overexertion from prolonged static or awkward postures, initial encounter: Secondary | ICD-10-CM | POA: Insufficient documentation

## 2016-07-30 DIAGNOSIS — Y939 Activity, unspecified: Secondary | ICD-10-CM | POA: Insufficient documentation

## 2016-07-30 DIAGNOSIS — M1712 Unilateral primary osteoarthritis, left knee: Secondary | ICD-10-CM | POA: Diagnosis not present

## 2016-07-30 DIAGNOSIS — Y92000 Kitchen of unspecified non-institutional (private) residence as  the place of occurrence of the external cause: Secondary | ICD-10-CM | POA: Diagnosis not present

## 2016-07-30 DIAGNOSIS — Y999 Unspecified external cause status: Secondary | ICD-10-CM | POA: Insufficient documentation

## 2016-07-30 DIAGNOSIS — I110 Hypertensive heart disease with heart failure: Secondary | ICD-10-CM | POA: Insufficient documentation

## 2016-07-30 DIAGNOSIS — M25562 Pain in left knee: Secondary | ICD-10-CM

## 2016-07-30 DIAGNOSIS — S93402A Sprain of unspecified ligament of left ankle, initial encounter: Secondary | ICD-10-CM | POA: Insufficient documentation

## 2016-07-30 DIAGNOSIS — IMO0001 Reserved for inherently not codable concepts without codable children: Secondary | ICD-10-CM

## 2016-07-30 DIAGNOSIS — S99912A Unspecified injury of left ankle, initial encounter: Secondary | ICD-10-CM | POA: Diagnosis present

## 2016-07-30 DIAGNOSIS — I509 Heart failure, unspecified: Secondary | ICD-10-CM | POA: Insufficient documentation

## 2016-07-30 MED ORDER — TRAMADOL HCL 50 MG PO TABS: 50.0000 mg | ORAL_TABLET | Freq: Four times a day (QID) | ORAL | 0 refills | Status: DC | PRN

## 2016-07-30 MED ORDER — KETOROLAC TROMETHAMINE 60 MG/2ML IM SOLN
30.0000 mg | Freq: Once | INTRAMUSCULAR | Status: AC
  Administered 2016-07-30: 30 mg via INTRAMUSCULAR
  Filled 2016-07-30: qty 2

## 2016-07-30 NOTE — ED Notes (Signed)
Pt states fell earlier today and landed on L knee at approx 1000. Pt c/o pain at this time. Pedal pulses intact at this time. Swelling noted to patient's L knee and L ankle.

## 2016-07-30 NOTE — ED Notes (Signed)
NAD noted at time of D/C. Pt taken to the lobby via wheelchair. Pt denies any comments/concerns at this time.

## 2016-07-30 NOTE — ED Triage Notes (Signed)
Patient reports slipped on a wet floor this morning and landed on her left knee and then left ankle.  Reports increased pain over the day.

## 2016-07-30 NOTE — Discharge Instructions (Signed)
Please use walker to help with ambulation. Rest ice and elevate the ankle. His ankle stirrup splint as needed. Follow-up with primary care or orthopedic doctor in 5-7 days if no improvement. Take tramadol and ibuprofen as needed for pain.

## 2016-07-30 NOTE — ED Provider Notes (Signed)
[facility] Provider Note   CSN: [id number] Arrival date & time: 07/30/16  1916     History   Chief Complaint Chief Complaint  Patient presents with  . Ankle Pain  . Knee Pain    HPI Stacie Shah is a 46 y.o. female presents to the emergency department for evaluation of left ankle, left knee pain. Patient was in her kitchen earlier this morning when she slipped, fell onto her left knee and twisted her left ankle. She has been ambulatory but with a limp. Her pain is 9 out of 10 in along the medial and lateral aspect the left ankle as well as the anterior medial aspect of the left knee. She denies any groin pain. She denies any numbness tingling or radicular symptoms. No head trauma, neck or lower back pain. She has had no medications for pain.  HPI  Past Medical History:  Diagnosis Date  . CHF (congestive heart failure) ([place])   . Depression   . Hypertension     There are no active problems to display for this patient.   Past Surgical History:  Procedure Laterality Date  . CHOLECYSTECTOMY    . FOOT SURGERY     cyst removal from heel  . REPEAT CESAREAN SECTION    . TYMPANOSTOMY      OB History    No data available       Home Medications    Prior to Admission medications   Medication Sig Start Date End Date Taking? Authorizing Provider  amoxicillin (AMOXIL) 500 MG capsule Take 1 capsule (500 mg total) by mouth 3 (three) times daily. 11/04/15   Triplett, Tammy, PA-C  aspirin EC 81 MG tablet Take 81 mg by mouth daily. 08/23/10   [provider]  DULoxetine (CYMBALTA) 60 MG capsule Take 120 mg by mouth daily. 08/31/15   [provider]  furosemide (LASIX) 40 MG tablet Take 60 mg by mouth daily. 08/31/15   [provider]  hydrOXYzine (ATARAX/VISTARIL) 25 MG tablet Take 25 mg by mouth every 8 (eight) hours as needed for anxiety.    [provider]  lisinopril (PRINIVIL,ZESTRIL) 40 MG tablet Take 40 mg by mouth daily.  08/31/15   [provider]  magnesium oxide (MAG-OX) 400 MG tablet Take 400 mg by mouth daily. 07/27/10   [provider]  meloxicam (MOBIC) 15 MG tablet Take 1 tablet (15 mg total) by mouth daily. 01/05/16   Cuthriell, Charline Bills, PA-C  naproxen (NAPROSYN) 500 MG tablet Take 1 tablet (500 mg total) by mouth 2 (two) times daily. 11/23/15   Rancour, Annie Main, MD  neomycin-polymyxin-hydrocortisone (CORTISPORIN) 3.5-10000-1 otic suspension Place 4 drops into the left ear 3 (three) times daily. 11/04/15   Triplett, Tammy, PA-C  omeprazole (PRILOSEC) 20 MG capsule Take 20 mg by mouth daily. 08/31/15   [provider]  traMADol (ULTRAM) 50 MG tablet Take 1 tablet (50 mg total) by mouth every 6 (six) hours as needed. 07/30/16   Duanne Guess, PA-C  traZODone (DESYREL) 100 MG tablet Take 100 mg by mouth at bedtime. 08/31/15   [provider]    Family History No family history on file.  Social History Social History  Substance Use Topics  . Smoking status: Never Smoker  . Smokeless tobacco: Never Used  . Alcohol use Yes     Comment: socially      Allergies   Sulfa antibiotics   Review of Systems Review of Systems  Constitutional: Negative for activity  change, chills, fatigue and fever.  HENT: Negative for congestion, sinus pressure and sore throat.   Eyes: Negative for visual disturbance.  Respiratory: Negative for cough, chest tightness and shortness of breath.   Cardiovascular: Negative for chest pain and leg swelling.  Gastrointestinal: Negative for abdominal pain, diarrhea, nausea and vomiting.  Genitourinary: Negative for dysuria.  Musculoskeletal: Positive for arthralgias, gait problem and joint swelling. Negative for back pain and neck pain.  Skin: Negative for rash and wound.  Neurological: Negative for dizziness, syncope, weakness, numbness and headaches.  Hematological: Negative for adenopathy.  Psychiatric/Behavioral: Negative for agitation,  behavioral problems and confusion.     Physical Exam Updated Vital Signs BP 138/89 (BP Location: Right Arm)   Pulse 95   Temp 99.1 F (37.3 C) (Oral)   Resp 20   Ht 5\' 5"  (1.651 m)   Wt (!) 400 lb (181.4 kg)   LMP 07/04/2016 (Approximate)   SpO2 97%   BMI 66.56 kg/m   Physical Exam  Constitutional: She is oriented to person, place, and time. She appears well-developed and well-nourished. No distress.  HENT:  Head: Normocephalic and atraumatic.  Mouth/Throat: Oropharynx is clear and moist.  Eyes: Conjunctivae and EOM are normal. Right eye exhibits no discharge. Left eye exhibits no discharge.  Neck: Normal range of motion. Neck supple.  Cardiovascular: Normal rate, regular rhythm and intact distal pulses.   Pulmonary/Chest: Effort normal and breath sounds normal. No respiratory distress. She exhibits no tenderness.  Musculoskeletal: Normal range of motion. She exhibits no edema.  Examination of the left lower extremity shows patient is able to straight leg raise. She has negative logroll test. She has normal active extension of the left knee. She is tender along the patella, medial joint line of the left knee. Stable to valgus and varus stress testing to the knee. No effusion noted throughout the knee. She has mild swelling throughout the left ankle with tenderness on the medial lateral malleoli. No tenderness throughout the calcaneus, tarsal bones. The skin breakdown noted.  Neurological: She is alert and oriented to person, place, and time. She has normal reflexes.  Skin: Skin is warm and dry.  Psychiatric: She has a normal mood and affect. Her behavior is normal. Thought content normal.     ED Treatments / Results  Labs (all labs ordered are listed, but only abnormal results are displayed) Labs Reviewed - No data to display  EKG  EKG Interpretation None       Radiology Dg Ankle Complete Left  Result Date: 07/30/2016 CLINICAL DATA:  Fall this morning injuring left  knee and ankle. EXAM: LEFT ANKLE COMPLETE - 3+ VIEW COMPARISON:  None. FINDINGS: Soft tissue swelling diffusely over the ankle. Ankle mortise is within normal. Chronic fragment adjacent the tip of the medial malleolus. Mild degenerative change over the tibiotalar joint, talocalcaneal joint and midfoot. Small inferior calcaneal spur. IMPRESSION: No acute fracture or dislocation. Electronically Signed   By: Marin Olp M.D.   On: 07/30/2016 20:14   Dg Knee Complete 4 Views Left  Result Date: 07/30/2016 CLINICAL DATA:  Fall this morning with left knee and ankle pain. EXAM: LEFT KNEE - COMPLETE 4+ VIEW COMPARISON:  None. 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 No acute fracture or dislocation. No significant joint effusion. IMPRESSION: No acute fracture. Electronically Signed   By: Marin Olp M.D.   On: 07/30/2016 20:16    Procedures Procedures (including critical care time) SPLINT APPLICATION Date/Time: 3:50 PM Authorized by: Dorise Hiss  CHRISTOPHER Consent: Verbal consent obtained. Risks and benefits: risks, benefits and alternatives were discussed Consent given by: patient Splint applied by: ed tech Location details: Left ankle  Splint type: Prefabricated left ankle splint  Supplies used: Prefabricated left ankle splint  Post-procedure: The splinted body part was neurovascularly unchanged following the procedure. Patient tolerance: Patient tolerated the procedure well with no immediate complications.     Medications Ordered in ED Medications  ketorolac (TORADOL) injection 30 mg (30 mg Intramuscular Given 07/30/16 2016)     Initial Impression / Assessment and Plan / ED Course  I have reviewed the triage vital signs and the nursing notes.  Pertinent labs & imaging results that were available during my care of the patient were reviewed by me and considered in my medical decision making (see chart for details).     46 year old female with left knee  pain, left ankle pain. X-rays of both knee and ankle negative for any acute bony abnormality. She has significant joint space narrowing in the medial compartment, pain likely from an acute flareup of her arthritic knee pain. No ligamentous laxity. Patient is given a walker to help with ambulation. She will be placed into a stirrup splint. Tylenol and ibuprofen as needed for mild to moderate pain. Tramadol as needed for severe pain.  Final Clinical Impressions(s) / ED Diagnoses   Final diagnoses:  Primary osteoarthritis of left knee  Acute pain of left knee  First degree ankle sprain, left, initial encounter    New Prescriptions New Prescriptions   TRAMADOL (ULTRAM) 50 MG TABLET    Take 1 tablet (50 mg total) by mouth every 6 (six) hours as needed.     Duanne Guess, PA-C 07/30/16 2045    Earleen Newport, MD 08/01/16 [phone]

## 2016-08-10 DIAGNOSIS — K219 Gastro-esophageal reflux disease without esophagitis: Secondary | ICD-10-CM

## 2016-10-19 DIAGNOSIS — G4733 Obstructive sleep apnea (adult) (pediatric): Secondary | ICD-10-CM

## 2016-12-13 ENCOUNTER — Other Ambulatory Visit: Payer: Self-pay | Admitting: Obstetrics & Gynecology

## 2016-12-13 DIAGNOSIS — Z1231 Encounter for screening mammogram for malignant neoplasm of breast: Secondary | ICD-10-CM

## 2017-02-13 DIAGNOSIS — F4323 Adjustment disorder with mixed anxiety and depressed mood: Secondary | ICD-10-CM | POA: Insufficient documentation

## 2017-02-16 DIAGNOSIS — E785 Hyperlipidemia, unspecified: Secondary | ICD-10-CM

## 2017-02-16 DIAGNOSIS — Z6841 Body Mass Index (BMI) 40.0 and over, adult: Secondary | ICD-10-CM | POA: Insufficient documentation

## 2017-02-16 DIAGNOSIS — E781 Pure hyperglyceridemia: Secondary | ICD-10-CM | POA: Insufficient documentation

## 2017-02-16 DIAGNOSIS — E8881 Metabolic syndrome: Secondary | ICD-10-CM | POA: Insufficient documentation

## 2017-05-06 ENCOUNTER — Emergency Department
Admission: EM | Admit: 2017-05-06 | Discharge: 2017-05-06 | Disposition: A | Discharge status: Home / Self Care | Payer: Medicaid Other | Attending: Emergency Medicine | Admitting: Emergency Medicine

## 2017-05-06 ENCOUNTER — Encounter: Payer: Self-pay | Admitting: Emergency Medicine

## 2017-05-06 ENCOUNTER — Other Ambulatory Visit: Payer: Self-pay

## 2017-05-06 ENCOUNTER — Emergency Department: Payer: Medicaid Other

## 2017-05-06 DIAGNOSIS — Y929 Unspecified place or not applicable: Secondary | ICD-10-CM | POA: Insufficient documentation

## 2017-05-06 DIAGNOSIS — I509 Heart failure, unspecified: Secondary | ICD-10-CM | POA: Insufficient documentation

## 2017-05-06 DIAGNOSIS — Z79899 Other long term (current) drug therapy: Secondary | ICD-10-CM | POA: Insufficient documentation

## 2017-05-06 DIAGNOSIS — Z7982 Long term (current) use of aspirin: Secondary | ICD-10-CM | POA: Insufficient documentation

## 2017-05-06 DIAGNOSIS — S5001XA Contusion of right elbow, initial encounter: Secondary | ICD-10-CM | POA: Insufficient documentation

## 2017-05-06 DIAGNOSIS — S59901A Unspecified injury of right elbow, initial encounter: Secondary | ICD-10-CM | POA: Diagnosis present

## 2017-05-06 DIAGNOSIS — I110 Hypertensive heart disease with heart failure: Secondary | ICD-10-CM | POA: Diagnosis not present

## 2017-05-06 DIAGNOSIS — Y939 Activity, unspecified: Secondary | ICD-10-CM | POA: Insufficient documentation

## 2017-05-06 DIAGNOSIS — Y998 Other external cause status: Secondary | ICD-10-CM | POA: Insufficient documentation

## 2017-05-06 DIAGNOSIS — W19XXXA Unspecified fall, initial encounter: Secondary | ICD-10-CM | POA: Insufficient documentation

## 2017-05-06 MED ORDER — NAPROXEN 500 MG PO TABS: 500.0000 mg | ORAL_TABLET | Freq: Two times a day (BID) | ORAL | 0 refills | Status: DC

## 2017-05-06 NOTE — ED Provider Notes (Signed)
[facility] Emergency Department Provider Note  ____________________________________________   First MD Initiated Contact with Patient 05/06/17 1203     (approximate)  I have reviewed the triage vital signs and the nursing notes.   HISTORY  Chief Complaint Elbow Pain   HPI Stacie Shah is a 47 y.o. female 0 complaint of right elbow pain.  Patient states that she fell 1 month ago and had pain to her elbow at that time.  She was not seen anywhere.  She states that several days ago her elbow began hurting and increases with range of motion or lifting..  She has not taken any over-the-counter medication for her pain.  Patient rates her pain as an 8 out of 10.   Past Medical History:  Diagnosis Date  . CHF (congestive heart failure) ([place])   . Depression   . Hypertension     There are no active problems to display for this patient.   Past Surgical History:  Procedure Laterality Date  . CHOLECYSTECTOMY    . FOOT SURGERY     cyst removal from heel  . REPEAT CESAREAN SECTION    . TYMPANOSTOMY      Prior to Admission medications   Medication Sig Start Date End Date Taking? Authorizing Provider  aspirin EC 81 MG tablet Take 81 mg by mouth daily. 08/23/10   [provider]  DULoxetine (CYMBALTA) 60 MG capsule Take 120 mg by mouth daily. 08/31/15   [provider]  furosemide (LASIX) 40 MG tablet Take 60 mg by mouth daily. 08/31/15   [provider]  hydrOXYzine (ATARAX/VISTARIL) 25 MG tablet Take 25 mg by mouth every 8 (eight) hours as needed for anxiety.    [provider]  lisinopril (PRINIVIL,ZESTRIL) 40 MG tablet Take 40 mg by mouth daily. 08/31/15   [provider]  magnesium oxide (MAG-OX) 400 MG tablet Take 400 mg by mouth daily. 07/27/10   [provider]  naproxen (NAPROSYN) 500 MG tablet Take 1 tablet (500 mg total) by mouth 2 (two) times daily with a meal. 05/06/17   Johnn Hai, PA-C    omeprazole (PRILOSEC) 20 MG capsule Take 20 mg by mouth daily. 08/31/15   [provider]  traZODone (DESYREL) 100 MG tablet Take 100 mg by mouth at bedtime. 08/31/15   [provider]    Allergies Sulfa antibiotics  No family history on file.  Social History Social History   Tobacco Use  . Smoking status: Never Smoker  . Smokeless tobacco: Never Used  Substance Use Topics  . Alcohol use: Yes    Comment: socially   . Drug use: No    Review of Systems Constitutional: No fever/chills Cardiovascular: Denies chest pain. Respiratory: Denies shortness of breath. Musculoskeletal: Positive for right elbow pain. Skin: Negative for rash. Neurological: Negative for focal weakness or numbness. ___________________________________________   PHYSICAL EXAM:  VITAL SIGNS: ED Triage Vitals [05/06/17 1155]  Enc Vitals Group     BP 129/78     Pulse Rate 86     Resp 18     Temp 98.7 F (37.1 C)     Temp Source Oral     SpO2 98 %     Weight (!) 425 lb (192.8 kg)     Height 5\' 5"  (1.651 m)     Head Circumference      Peak Flow      Pain Score 8     Pain Loc  Pain Edu?      Excl. in [place]?    Constitutional: Alert and oriented. Well appearing and in no acute distress.  Morbidly obese. Eyes: Conjunctivae are normal. Head: Atraumatic. Neck: No stridor.   Cardiovascular: Normal rate, regular rhythm. Grossly normal heart sounds.  Good peripheral circulation. Respiratory: Normal respiratory effort.  No retractions. Lungs CTAB. Musculoskeletal: On examination of the right elbow there is no gross deformity noted however there is tenderness on palpation of the lateral epicondylar area.  Range of motion is minimally restricted secondary to discomfort.  Skin is intact.  Motor sensory function intact distal to her injury. Neurologic:  Normal speech and language. No gross focal neurologic deficits are appreciated. No gait instability. Skin:  Skin is warm, dry and intact. No  rash noted. Psychiatric: Mood and affect are normal. Speech and behavior are normal.  ____________________________________________   LABS (all labs ordered are listed, but only abnormal results are displayed)  Labs Reviewed - No data to display   RADIOLOGY    ED MD interpretation:   Right elbow xray is negative for fracture.  Official radiology report(s): Dg Elbow Complete Right  Result Date: 05/06/2017 CLINICAL DATA:  Pt states she fell around 1 month ago and landed on RT elbow; pt states that while it was initially painful it began to get better until she "swatted at the dog" 1 week ago; pt states she has now had continued RT elbow pain since the 2nd incident 1 week ago EXAM: RIGHT ELBOW - COMPLETE 3+ VIEW COMPARISON:  None. FINDINGS: No fracture.  No bone lesion. Elbow joint is normally spaced and aligned. Small marginal spurs are noted at the radiocapitellar and ulnar trochlear articulations. No other degenerative/arthropathic change. No joint effusion. Soft tissues are unremarkable. IMPRESSION: 1. No fracture or acute finding.  No bone lesion. 2. Mild elbow joint degenerative change.  No joint effusion. Electronically Signed   By: Lajean Manes M.D.   On: 05/06/2017 12:32    ____________________________________________   PROCEDURES  Procedure(s) performed: None  Procedures  Critical Care performed: No  ____________________________________________   INITIAL IMPRESSION / ASSESSMENT AND PLAN / ED COURSE  Patient was made aware that there was no acute fracture but that she does have degenerative joint changes.  She was given a prescription for Voltaren to begin taking 50 mg twice daily with food.  She is to follow-up with her PCP at [facility] primary for any continued problems.  She is encouraged to use ice as needed for her elbow pain.  ____________________________________________   FINAL CLINICAL IMPRESSION(S) / ED DIAGNOSES  Final diagnoses:  Contusion of right elbow,  initial encounter     ED Discharge Orders        Ordered    naproxen (NAPROSYN) 500 MG tablet  2 times daily with meals     05/06/17 1305       Note:  This document was prepared using Dragon voice recognition software and may include unintentional dictation errors.    Johnn Hai, PA-C 05/06/17 1403    Clearnce Hasten Randall An, MD 05/06/17 [phone]

## 2017-05-06 NOTE — Discharge Instructions (Signed)
Follow-up with your primary care doctor if any continued problems.  Begin taking naproxen 500 mg twice daily with food.  You may use ice to your elbow as needed for discomfort.

## 2017-05-06 NOTE — ED Triage Notes (Signed)
[address] [address] and landed on R elbow one month ago. Painful since.

## 2017-05-27 ENCOUNTER — Other Ambulatory Visit: Payer: Self-pay

## 2017-05-27 ENCOUNTER — Emergency Department
Admission: EM | Admit: 2017-05-27 | Discharge: 2017-05-27 | Disposition: A | Discharge status: Home / Self Care | Payer: Medicaid Other | Attending: Emergency Medicine | Admitting: Emergency Medicine

## 2017-05-27 ENCOUNTER — Encounter: Payer: Self-pay | Admitting: Emergency Medicine

## 2017-05-27 DIAGNOSIS — I509 Heart failure, unspecified: Secondary | ICD-10-CM | POA: Diagnosis not present

## 2017-05-27 DIAGNOSIS — H5713 Ocular pain, bilateral: Secondary | ICD-10-CM | POA: Diagnosis present

## 2017-05-27 DIAGNOSIS — H1013 Acute atopic conjunctivitis, bilateral: Secondary | ICD-10-CM | POA: Insufficient documentation

## 2017-05-27 DIAGNOSIS — Z79899 Other long term (current) drug therapy: Secondary | ICD-10-CM | POA: Insufficient documentation

## 2017-05-27 DIAGNOSIS — I110 Hypertensive heart disease with heart failure: Secondary | ICD-10-CM | POA: Insufficient documentation

## 2017-05-27 DIAGNOSIS — Z7982 Long term (current) use of aspirin: Secondary | ICD-10-CM | POA: Insufficient documentation

## 2017-05-27 MED ORDER — OLOPATADINE HCL 0.1 % OP SOLN: 1.0000 [drp] | Freq: Two times a day (BID) | OPHTHALMIC | 1 refills | Status: AC

## 2017-05-27 NOTE — ED Provider Notes (Signed)
[facility] Emergency Department Provider Note  ____________________________________________  Time seen: Approximately 6:49 PM  I have reviewed the triage vital signs and the nursing notes.   HISTORY  Chief Complaint Eye Pain    HPI Stacie Shah is a 47 y.o. female presents to the emergency department with bilateral conjunctivitis, increased tearing and photophobia for the past 4 days.  Patient denies pain with extraocular eye muscle movement or globe trauma.  No foreign body sensation.  Patient reports that she has experienced similar symptoms in the past seasonally.  No alleviating measures have been attempted.   Past Medical History:  Diagnosis Date  . CHF (congestive heart failure) ([place])   . Depression   . Hypertension     There are no active problems to display for this patient.   Past Surgical History:  Procedure Laterality Date  . CHOLECYSTECTOMY    . FOOT SURGERY     cyst removal from heel  . REPEAT CESAREAN SECTION    . TYMPANOSTOMY      Prior to Admission medications   Medication Sig Start Date End Date Taking? Authorizing Provider  aspirin EC 81 MG tablet Take 81 mg by mouth daily. 08/23/10   [provider]  DULoxetine (CYMBALTA) 60 MG capsule Take 120 mg by mouth daily. 08/31/15   [provider]  furosemide (LASIX) 40 MG tablet Take 60 mg by mouth daily. 08/31/15   [provider]  hydrOXYzine (ATARAX/VISTARIL) 25 MG tablet Take 25 mg by mouth every 8 (eight) hours as needed for anxiety.    [provider]  lisinopril (PRINIVIL,ZESTRIL) 40 MG tablet Take 40 mg by mouth daily. 08/31/15   [provider]  magnesium oxide (MAG-OX) 400 MG tablet Take 400 mg by mouth daily. 07/27/10   [provider]  naproxen (NAPROSYN) 500 MG tablet Take 1 tablet (500 mg total) by mouth 2 (two) times daily with a meal. 05/06/17   Letitia Neri L, PA-C  olopatadine (PATANOL) 0.1 % ophthalmic solution  Place 1 drop into both eyes 2 (two) times daily. 05/27/17 05/27/18  Lannie Fields, PA-C  omeprazole (PRILOSEC) 20 MG capsule Take 20 mg by mouth daily. 08/31/15   [provider]  traZODone (DESYREL) 100 MG tablet Take 100 mg by mouth at bedtime. 08/31/15   [provider]    Allergies Sulfa antibiotics  No family history on file.  Social History Social History   Tobacco Use  . Smoking status: Never Smoker  . Smokeless tobacco: Never Used  Substance Use Topics  . Alcohol use: Yes    Comment: socially   . Drug use: No     Review of Systems  Constitutional: No fever/chills Eyes: No visual changes. Patient has bilateral conjunctivitis. ENT: No upper respiratory complaints. Cardiovascular: no chest pain. Respiratory: no cough. No SOB. Gastrointestinal: No abdominal pain.  No nausea, no vomiting.  No diarrhea.  No constipation. Musculoskeletal: Negative for musculoskeletal pain. Skin: Negative for rash, abrasions, lacerations, ecchymosis. Neurological: Negative for headaches, focal weakness or numbness.   ____________________________________________   PHYSICAL EXAM:  VITAL SIGNS: ED Triage Vitals  Enc Vitals Group     BP 05/27/17 1650 139/75     Pulse Rate 05/27/17 1650 99     Resp --      Temp 05/27/17 1650 98.6 F (37 C)     Temp Source 05/27/17 1650 Oral     SpO2 05/27/17 1650 92 %     Weight 05/27/17 1650 (!)  427 lb (193.7 kg)     Height 05/27/17 1650 5\' 5"  (1.651 m)     Head Circumference --      Peak Flow --      Pain Score 05/27/17 1655 8     Pain Loc --      Pain Edu? --      Excl. in [place]? --      Constitutional: Alert and oriented. Well appearing and in no acute distress. Eyes: Patient has bilateral conjunctivitis with increased tearing.  PERRL. EOMI. Head: Atraumatic. ENT:      Ears: TMs are pearly.       Nose: No congestion/rhinnorhea.      Mouth/Throat: Mucous membranes are moist.  Cardiovascular: Normal rate, regular rhythm.  Normal S1 and S2.  Good peripheral circulation. Respiratory: Normal respiratory effort without tachypnea or retractions. Lungs CTAB. Good air entry to the bases with no decreased or absent breath sounds. Musculoskeletal: Full range of motion to all extremities. No gross deformities appreciated. Neurologic:  Normal speech and language. No gross focal neurologic deficits are appreciated.  Skin:  Skin is warm, dry and intact. No rash noted.  ____________________________________________   LABS (all labs ordered are listed, but only abnormal results are displayed)  Labs Reviewed - No data to display ____________________________________________  EKG   ____________________________________________  RADIOLOGY   No results found.  ____________________________________________    PROCEDURES  Procedure(s) performed:    Procedures    Medications - No data to display   ____________________________________________   INITIAL IMPRESSION / ASSESSMENT AND PLAN / ED COURSE  Pertinent labs & imaging results that were available during my care of the patient were reviewed by me and considered in my medical decision making (see chart for details).  Review of the [place] CSRS was performed in accordance of the [place] prior to dispensing any controlled drugs.     Assessment and plan Allergic conjunctivitis Patient presents to the emergency department with bilateral conjunctivitis that patient has experienced in the past with changes in season.  Differential diagnosis originally included bacterial conjunctivitis, viral conjunctivitis and allergic conjunctivitis history and physical exam findings are consistent with allergic conjunctivitis at this time.  Patient was treated empirically with Patanol    ____________________________________________  FINAL CLINICAL IMPRESSION(S) / ED DIAGNOSES  Final diagnoses:  Allergic conjunctivitis of both eyes      NEW MEDICATIONS STARTED DURING THIS  VISIT:  ED Discharge Orders        Ordered    olopatadine (PATANOL) 0.1 % ophthalmic solution  2 times daily     05/27/17 1845          This chart was dictated using voice recognition software/Dragon. Despite best efforts to proofread, errors can occur which can change the meaning. Any change was purely unintentional.    Lannie Fields, PA-C 05/27/17 Eligah East, MD 05/28/17 0700

## 2017-05-27 NOTE — ED Triage Notes (Signed)
Pt to ED via POV c/o bilateral eye pain, itching, watering, and light sensitivity. Pt states that she has had her symptoms since Wednesday. Symptoms worse in left eye than right.

## 2018-03-15 DIAGNOSIS — E119 Type 2 diabetes mellitus without complications: Secondary | ICD-10-CM | POA: Insufficient documentation

## 2018-09-06 IMAGING — DX DG OS CALCIS 2+V*R*
2 series · 2 of 2 positions shown · non-contrast
Comparison: None.

CLINICAL DATA: Right heel pain

EXAM:
RIGHT OS CALCIS - 2+ VIEW

[calcaneus axial]
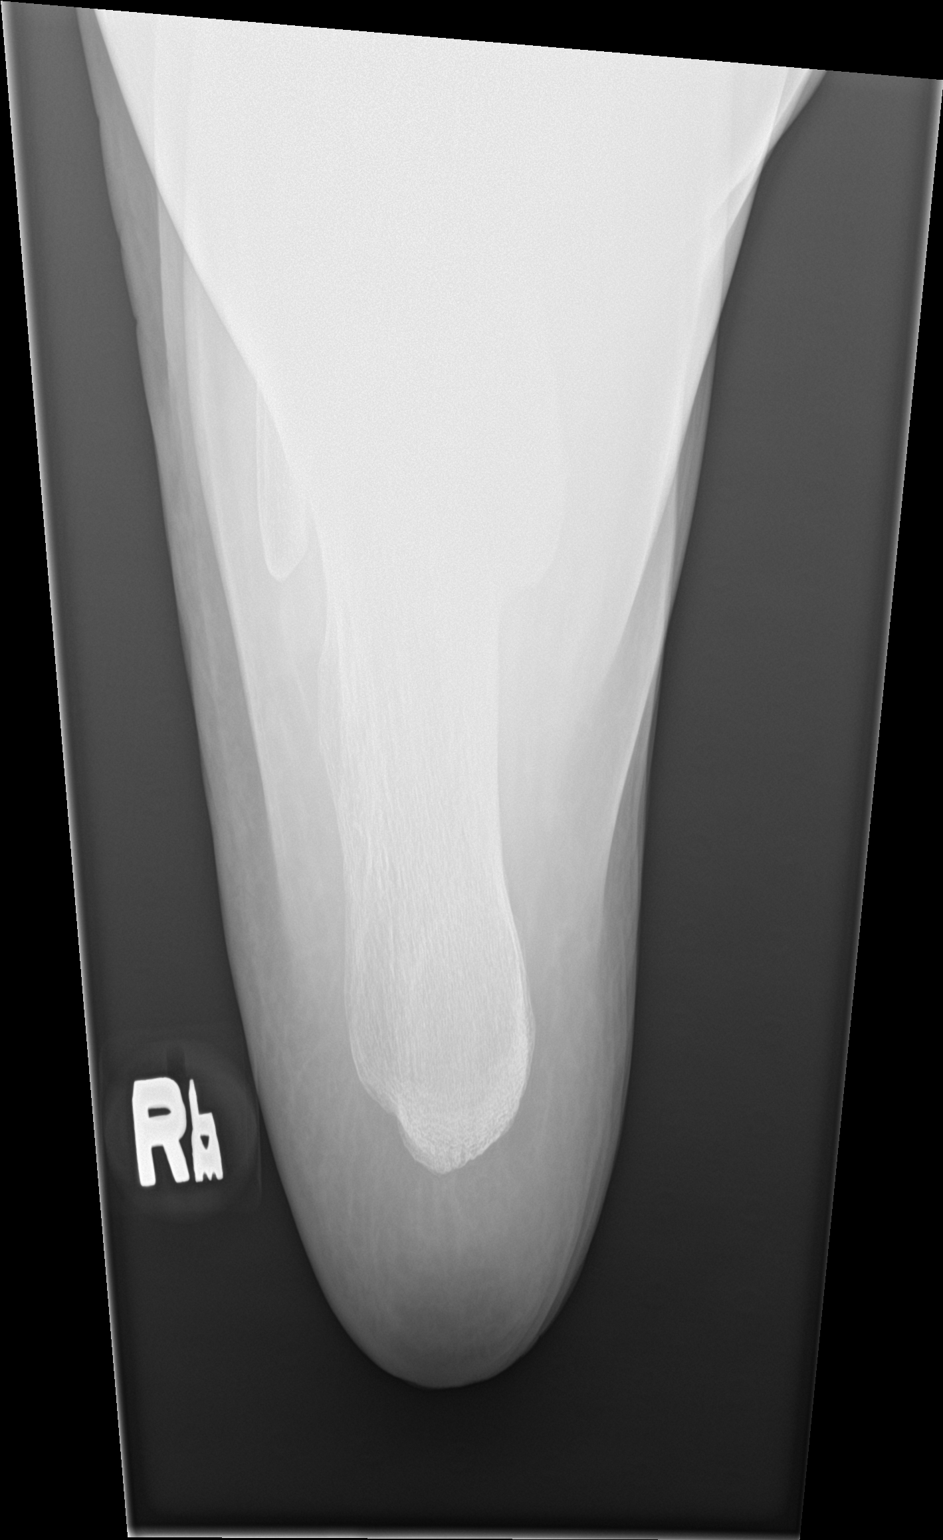

[calcaneus lat]
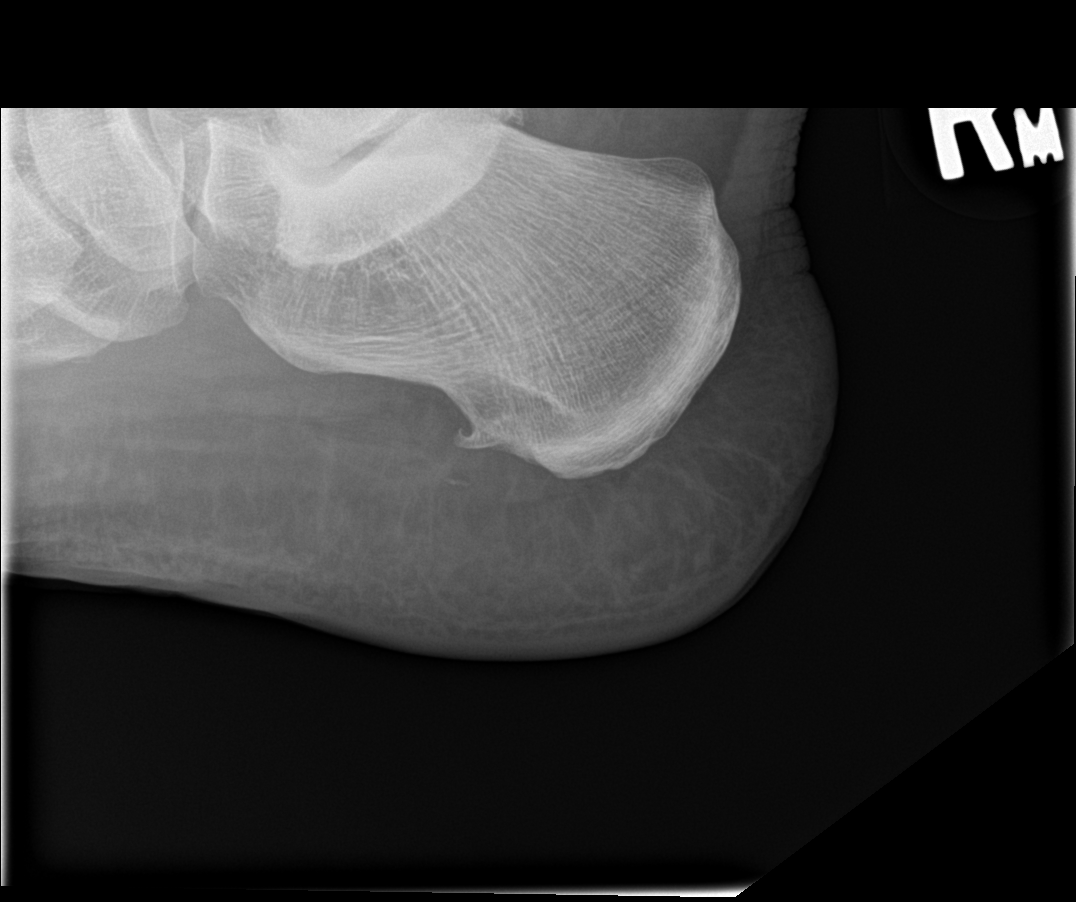

[2 of 2 positions shown; findings below may reference images not displayed]

FINDINGS: No fracture or dislocation is seen.

Small plantar calcaneal enthesophyte.

Visualized soft tissues are within normal limits.
IMPRESSION: Small plantar calcaneal enthesophyte.

## 2018-12-01 ENCOUNTER — Emergency Department: Payer: Medicaid Other

## 2018-12-01 ENCOUNTER — Encounter: Payer: Self-pay | Admitting: Emergency Medicine

## 2018-12-01 ENCOUNTER — Other Ambulatory Visit: Payer: Self-pay

## 2018-12-01 ENCOUNTER — Emergency Department
Admission: EM | Admit: 2018-12-01 | Discharge: 2018-12-01 | Disposition: A | Discharge status: Home / Self Care | Payer: Medicaid Other | Attending: Emergency Medicine | Admitting: Emergency Medicine

## 2018-12-01 DIAGNOSIS — E860 Dehydration: Secondary | ICD-10-CM | POA: Diagnosis not present

## 2018-12-01 DIAGNOSIS — K529 Noninfective gastroenteritis and colitis, unspecified: Secondary | ICD-10-CM | POA: Insufficient documentation

## 2018-12-01 DIAGNOSIS — R1084 Generalized abdominal pain: Secondary | ICD-10-CM | POA: Diagnosis present

## 2018-12-01 DIAGNOSIS — I110 Hypertensive heart disease with heart failure: Secondary | ICD-10-CM | POA: Insufficient documentation

## 2018-12-01 DIAGNOSIS — I509 Heart failure, unspecified: Secondary | ICD-10-CM | POA: Diagnosis not present

## 2018-12-01 DIAGNOSIS — Z79899 Other long term (current) drug therapy: Secondary | ICD-10-CM | POA: Diagnosis not present

## 2018-12-01 DIAGNOSIS — Z20828 Contact with and (suspected) exposure to other viral communicable diseases: Secondary | ICD-10-CM | POA: Diagnosis not present

## 2018-12-01 LAB — URINALYSIS, COMPLETE (UACMP) WITH MICROSCOPIC
Bilirubin Urine: NEGATIVE
Glucose, UA: NEGATIVE mg/dL
Hgb urine dipstick: NEGATIVE
Ketones, ur: NEGATIVE mg/dL
Nitrite: NEGATIVE
Protein, ur: NEGATIVE mg/dL
Specific Gravity, Urine: 1.023 (ref 1.005–1.030)
pH: 5 (ref 5.0–8.0)

## 2018-12-01 LAB — CBC
HCT: 39.1 % (ref 36.0–46.0)
Hemoglobin: 13.6 g/dL (ref 12.0–15.0)
MCH: 32.6 pg (ref 26.0–34.0)
MCHC: 34.8 g/dL (ref 30.0–36.0)
MCV: 93.8 fL (ref 80.0–100.0)
Platelets: 223 10*3/uL (ref 150–400)
RBC: 4.17 MIL/uL (ref 3.87–5.11)
RDW: 11.7 % (ref 11.5–15.5)
WBC: 6.6 10*3/uL (ref 4.0–10.5)
nRBC: 0 % (ref 0.0–0.2)

## 2018-12-01 LAB — COMPREHENSIVE METABOLIC PANEL
ALT: 17 U/L (ref 0–44)
AST: 22 U/L (ref 15–41)
Albumin: 3.9 g/dL (ref 3.5–5.0)
Alkaline Phosphatase: 80 U/L (ref 38–126)
Anion gap: 10 (ref 5–15)
BUN: 13 mg/dL (ref 6–20)
CO2: 21 mmol/L — ABNORMAL LOW (ref 22–32)
Calcium: 8.8 mg/dL — ABNORMAL LOW (ref 8.9–10.3)
Chloride: 105 mmol/L (ref 98–111)
Creatinine, Ser: 0.94 mg/dL (ref 0.44–1.00)
GFR calc Af Amer: >60 mL/min (ref >60)
GFR calc non Af Amer: >60 mL/min (ref >60)
Glucose, Bld: 105 mg/dL — ABNORMAL HIGH (ref 70–99)
Potassium: 3.8 mmol/L (ref 3.5–5.1)
Sodium: 136 mmol/L (ref 135–145)
Total Bilirubin: 1.1 mg/dL (ref 0.3–1.2)
Total Protein: 7.4 g/dL (ref 6.5–8.1)

## 2018-12-01 LAB — LIPASE, BLOOD: Lipase: 34 U/L (ref 11–51)

## 2018-12-01 LAB — PREGNANCY, URINE: Preg Test, Ur: NEGATIVE

## 2018-12-01 MED ORDER — ONDANSETRON HCL 4 MG/2ML IJ SOLN
4.0000 mg | Freq: Once | INTRAMUSCULAR | Status: AC
  Administered 2018-12-01: 15:00:00 4 mg via INTRAVENOUS
  Filled 2018-12-01: qty 2

## 2018-12-01 MED ORDER — SODIUM CHLORIDE 0.9 % IV BOLUS
1000.0000 mL | Freq: Once | INTRAVENOUS | Status: AC
  Administered 2018-12-01: 1000 mL via INTRAVENOUS

## 2018-12-01 MED ORDER — IOHEXOL 300 MG/ML  SOLN
125.0000 mL | Freq: Once | INTRAMUSCULAR | Status: AC | PRN
  Administered 2018-12-01: 125 mL via INTRAVENOUS

## 2018-12-01 MED ORDER — SODIUM CHLORIDE 0.9% FLUSH
3.0000 mL | Freq: Once | INTRAVENOUS | Status: AC
  Administered 2018-12-01: 15:00:00 3 mL via INTRAVENOUS

## 2018-12-01 NOTE — ED Provider Notes (Signed)
-----------------------------------------   3:04 PM on 12/01/2018 -----------------------------------------  Blood pressure 114/71, pulse 65, temperature 98.3 F (36.8 C), temperature source Oral, resp. rate 18, height 5\' 5"  (1.651 m), weight (!) 149.7 kg, last menstrual period 11/10/2018, SpO2 99 %.  Assuming care from Dr. Joni Fears.  In short, Stacie Shah is a 48 y.o. female with a chief complaint of Abdominal Pain .  Refer to the original H&P for additional details.  The current plan of care is to follow-up CT abdomen/pelvis results, lab work thus far unremarkable.  CT abdomen/pelvis negative for acute process, does show evidence of enteritis.  Counseled patient to use loperamide as needed for diarrhea and to keep up with p.o. hydration.  Counseled patient to follow-up with her PCP and return to the ED for new or worsening symptoms, patient agrees with plan.      Blake Divine, MD 12/01/18 [phone]

## 2018-12-01 NOTE — ED Provider Notes (Signed)
[facility] Emergency Department Provider Note  ____________________________________________  Time seen: Approximately 3:26 PM  I have reviewed the triage vital signs and the nursing notes.   HISTORY  Chief Complaint Abdominal Pain    HPI Stacie Shah is a 48 y.o. female with a history of CHF depression hypertension morbid obesity and gastric sleeve surgery who comes the ED complaining of generalized abdominal pain for the past 2 days associated with watery diarrhea.  No black or bloody stool.  No vomiting but she does have nausea and decreased oral intake and feels dehydrated.  Symptoms are constant, waxing waning, nonradiating, no aggravating or alleviating factors.  Her 26-year-old family relative was recently sick with a diarrheal illness.    Past Medical History:  Diagnosis Date  . CHF (congestive heart failure) ([place])   . Depression   . Hypertension      There are no active problems to display for this patient.    Past Surgical History:  Procedure Laterality Date  . ABDOMINAL SURGERY     Gastric Sleeve  . CHOLECYSTECTOMY    . FOOT SURGERY     cyst removal from heel  . REPEAT CESAREAN SECTION    . TYMPANOSTOMY       Prior to Admission medications   Medication Sig Start Date End Date Taking? Authorizing Provider  albuterol (VENTOLIN HFA) 108 (90 Base) MCG/ACT inhaler Inhale 2 puffs into the lungs every 4 (four) hours as needed for wheezing or shortness of breath.   Yes [provider]  atorvastatin (LIPITOR) 10 MG tablet Take 10 mg by mouth daily.   Yes [provider]  Calcium Citrate-Vitamin D (CITRACAL PETITES/VITAMIN D) 200-250 MG-UNIT TABS Take 1-2 tablets by mouth as directed.   Yes [provider]  DULoxetine (CYMBALTA) 60 MG capsule Take 60 mg by mouth 2 (two) times daily.    Yes [provider]  fluticasone (FLONASE) 50 MCG/ACT nasal spray Place 2 sprays into both nostrils daily.   Yes  [provider]  lisinopril (ZESTRIL) 10 MG tablet Take 10 mg by mouth daily.    Yes [provider]  omeprazole (PRILOSEC) 20 MG capsule Take 20 mg by mouth daily. 08/31/15  Yes [provider]  topiramate (TOPAMAX) 100 MG tablet Take 100 mg by mouth daily.   Yes [provider]  traZODone (DESYREL) 150 MG tablet Take 150 mg by mouth at bedtime.    Yes [provider]     Allergies Sulfa antibiotics   No family history on file.  Social History Social History   Tobacco Use  . Smoking status: Never Smoker  . Smokeless tobacco: Never Used  Substance Use Topics  . Alcohol use: Yes    Comment: socially   . Drug use: No    Review of Systems  Constitutional:   No fever positive chills.  ENT:   No sore throat. No rhinorrhea. Cardiovascular:   No chest pain or syncope. Respiratory:   No dyspnea or cough. Gastrointestinal:   Positive as above for abdominal pain and diarrhea Musculoskeletal:   Negative for focal pain or swelling All other systems reviewed and are negative except as documented above in ROS and HPI.  ____________________________________________   PHYSICAL EXAM:  VITAL SIGNS: ED Triage Vitals  Enc Vitals Group     BP 12/01/18 1147 122/78     Pulse Rate 12/01/18 1147 91     Resp 12/01/18 1147 18     Temp 12/01/18 1147 98.3  F (36.8 C)     Temp Source 12/01/18 1147 Oral     SpO2 12/01/18 1147 95 %     Weight 12/01/18 1151 (!) 330 lb (149.7 kg)     Height 12/01/18 1151 5\' 5"  (1.651 m)     Head Circumference --      Peak Flow --      Pain Score 12/01/18 1150 8     Pain Loc --      Pain Edu? --      Excl. in [place]? --     Vital signs reviewed, nursing assessments reviewed.   Constitutional:   Alert and oriented. Non-toxic appearance. Eyes:   Conjunctivae are normal. EOMI. PERRL. ENT      Head:   Normocephalic and atraumatic.        Mouth/Throat: Mask      Neck:   No meningismus. Full  ROM. Hematological/Lymphatic/Immunilogical:   No cervical lymphadenopathy. Cardiovascular:   RRR. Symmetric bilateral radial and DP pulses.  No murmurs. Cap refill less than 2 seconds. Respiratory:   Normal respiratory effort without tachypnea/retractions. Breath sounds are clear and equal bilaterally. No wheezes/rales/rhonchi. Gastrointestinal:   Soft and obese, there is generalized tenderness, nonfocal. Non distended.   No rebound, rigidity, or guarding.  Musculoskeletal:   Normal range of motion in all extremities. No joint effusions.  No lower extremity tenderness.  No edema. Neurologic:   Normal speech and language.  Motor grossly intact. No acute focal neurologic deficits are appreciated.  Skin:    Skin is warm, dry and intact. No rash noted.  No petechiae, purpura, or bullae.  ____________________________________________    LABS (pertinent positives/negatives) (all labs ordered are listed, but only abnormal results are displayed) Labs Reviewed  COMPREHENSIVE METABOLIC PANEL - Abnormal; Notable for the following components:      Result Value   CO2 21 (*)    Glucose, Bld 105 (*)    Calcium 8.8 (*)    All other components within normal limits  URINALYSIS, COMPLETE (UACMP) WITH MICROSCOPIC - Abnormal; Notable for the following components:   Color, Urine AMBER (*)    APPearance CLOUDY (*)    Leukocytes,Ua TRACE (*)    Bacteria, UA RARE (*)    All other components within normal limits  LIPASE, BLOOD  CBC  PREGNANCY, URINE  POC URINE PREG, ED   ____________________________________________   EKG    ____________________________________________    RADIOLOGY  No results found.  ____________________________________________   PROCEDURES Procedures  ____________________________________________  DIFFERENTIAL DIAGNOSIS   Small bowel obstruction, intra-abdominal abscess, diverticulitis, bowel perforation, viral syndrome, COVID-19  CLINICAL IMPRESSION / ASSESSMENT  AND PLAN / ED COURSE  Medications ordered in the ED: Medications  iohexol (OMNIPAQUE) 300 MG/ML solution 125 mL (has no administration in time range)  sodium chloride flush (NS) 0.9 % injection 3 mL (3 mLs Intravenous Given 12/01/18 1509)  sodium chloride 0.9 % bolus 1,000 mL (1,000 mLs Intravenous New Bag/Given 12/01/18 1509)  ondansetron (ZOFRAN) injection 4 mg (4 mg Intravenous Given 12/01/18 1509)    Pertinent labs & imaging results that were available during my care of the patient were reviewed by me and considered in my medical decision making (see chart for details).  JAYA TOP was evaluated in Emergency Department on 12/01/2018 for the symptoms described in the history of present illness. She was evaluated in the context of the global COVID-19 pandemic, which necessitated consideration that the patient might be at risk for infection with the SARS-CoV-2 virus  that causes COVID-19. Institutional protocols and algorithms that pertain to the evaluation of patients at risk for COVID-19 are in a state of rapid change based on information released by regulatory bodies including the CDC and federal and state organizations. These policies and algorithms were followed during the patient's care in the ED.   Patient presents with abdominal pain and diarrhea, clinically appears dehydrated.  No risk factors for C. difficile infection.  Check a COVID swab, CT abdomen pelvis.  Labs are unremarkable, vital signs are unremarkable.  IV fluids for hydration.      ____________________________________________   FINAL CLINICAL IMPRESSION(S) / ED DIAGNOSES    Final diagnoses:  Generalized abdominal pain  Dehydration     ED Discharge Orders    None      Portions of this note were generated with dragon dictation software. Dictation errors may occur despite best attempts at proofreading.   Carrie Mew, MD 12/01/18 [phone]

## 2018-12-01 NOTE — ED Triage Notes (Signed)
Lower abdominal pain and diarrhea x 2 days. No vomiting. History gastric sleeve surgery in January 2020.

## 2018-12-02 LAB — NOVEL CORONAVIRUS, NAA (HOSP ORDER, SEND-OUT TO REF LAB; TAT 18-24 HRS): SARS-CoV-2, NAA: NOT DETECTED

## 2019-01-07 ENCOUNTER — Other Ambulatory Visit: Payer: Self-pay

## 2019-01-07 DIAGNOSIS — Z20822 Contact with and (suspected) exposure to covid-19: Secondary | ICD-10-CM

## 2019-01-09 LAB — NOVEL CORONAVIRUS, NAA: SARS-CoV-2, NAA: NOT DETECTED

## 2019-04-22 DIAGNOSIS — R200 Anesthesia of skin: Secondary | ICD-10-CM | POA: Insufficient documentation

## 2019-04-22 DIAGNOSIS — R404 Transient alteration of awareness: Secondary | ICD-10-CM | POA: Insufficient documentation

## 2019-04-23 ENCOUNTER — Ambulatory Visit: Admit: 2019-04-23 | Payer: Medicaid Other

## 2019-04-23 SURGERY — PHACOEMULSIFICATION, CATARACT, WITH IOL INSERTION
Anesthesia: Topical | Laterality: Right

## 2019-05-08 ENCOUNTER — Ambulatory Visit: Payer: Medicaid Other | Attending: Neurology

## 2019-05-08 DIAGNOSIS — G4733 Obstructive sleep apnea (adult) (pediatric): Secondary | ICD-10-CM | POA: Insufficient documentation

## 2019-05-09 ENCOUNTER — Other Ambulatory Visit: Payer: Self-pay

## 2019-05-16 ENCOUNTER — Encounter: Payer: Self-pay | Admitting: Ophthalmology

## 2019-05-22 ENCOUNTER — Other Ambulatory Visit
Admission: RE | Admit: 2019-05-22 | Discharge: 2019-05-22 | Disposition: A | Discharge status: Home / Self Care | Payer: Medicaid Other | Source: Ambulatory Visit | Attending: Ophthalmology | Admitting: Ophthalmology

## 2019-05-22 ENCOUNTER — Other Ambulatory Visit: Payer: Self-pay

## 2019-05-22 DIAGNOSIS — Z01812 Encounter for preprocedural laboratory examination: Secondary | ICD-10-CM | POA: Insufficient documentation

## 2019-05-22 DIAGNOSIS — Z20822 Contact with and (suspected) exposure to covid-19: Secondary | ICD-10-CM | POA: Diagnosis not present

## 2019-05-22 LAB — SARS CORONAVIRUS 2 (TAT 6-24 HRS): SARS Coronavirus 2: NEGATIVE

## 2019-05-24 ENCOUNTER — Ambulatory Visit: Payer: Medicaid Other | Admitting: Registered Nurse

## 2019-05-24 ENCOUNTER — Ambulatory Visit
Admission: RE | Admit: 2019-05-24 | Discharge: 2019-05-24 | Disposition: A | Discharge status: Home / Self Care | Payer: Medicaid Other | Attending: Ophthalmology | Admitting: Ophthalmology

## 2019-05-24 ENCOUNTER — Other Ambulatory Visit: Payer: Self-pay

## 2019-05-24 ENCOUNTER — Encounter
Admission: RE | Disposition: A | Discharge status: Home / Self Care | Payer: Self-pay | Source: Home / Self Care | Attending: Ophthalmology

## 2019-05-24 ENCOUNTER — Encounter: Payer: Self-pay | Admitting: Ophthalmology

## 2019-05-24 DIAGNOSIS — Z9884 Bariatric surgery status: Secondary | ICD-10-CM | POA: Diagnosis not present

## 2019-05-24 DIAGNOSIS — F329 Major depressive disorder, single episode, unspecified: Secondary | ICD-10-CM | POA: Diagnosis not present

## 2019-05-24 DIAGNOSIS — I509 Heart failure, unspecified: Secondary | ICD-10-CM | POA: Diagnosis not present

## 2019-05-24 DIAGNOSIS — G4733 Obstructive sleep apnea (adult) (pediatric): Secondary | ICD-10-CM | POA: Diagnosis not present

## 2019-05-24 DIAGNOSIS — Z6841 Body Mass Index (BMI) 40.0 and over, adult: Secondary | ICD-10-CM | POA: Insufficient documentation

## 2019-05-24 DIAGNOSIS — E7800 Pure hypercholesterolemia, unspecified: Secondary | ICD-10-CM | POA: Insufficient documentation

## 2019-05-24 DIAGNOSIS — J45909 Unspecified asthma, uncomplicated: Secondary | ICD-10-CM | POA: Insufficient documentation

## 2019-05-24 DIAGNOSIS — Z8616 Personal history of COVID-19: Secondary | ICD-10-CM | POA: Insufficient documentation

## 2019-05-24 DIAGNOSIS — Z882 Allergy status to sulfonamides status: Secondary | ICD-10-CM | POA: Insufficient documentation

## 2019-05-24 DIAGNOSIS — Z9049 Acquired absence of other specified parts of digestive tract: Secondary | ICD-10-CM | POA: Diagnosis not present

## 2019-05-24 DIAGNOSIS — M1990 Unspecified osteoarthritis, unspecified site: Secondary | ICD-10-CM | POA: Insufficient documentation

## 2019-05-24 DIAGNOSIS — H2511 Age-related nuclear cataract, right eye: Secondary | ICD-10-CM | POA: Insufficient documentation

## 2019-05-24 DIAGNOSIS — I110 Hypertensive heart disease with heart failure: Secondary | ICD-10-CM | POA: Insufficient documentation

## 2019-05-24 DIAGNOSIS — K219 Gastro-esophageal reflux disease without esophagitis: Secondary | ICD-10-CM | POA: Insufficient documentation

## 2019-05-24 HISTORY — DX: Pure hypercholesterolemia, unspecified: E78.00

## 2019-05-24 HISTORY — DX: Unspecified asthma, uncomplicated: J45.909

## 2019-05-24 HISTORY — PX: CATARACT EXTRACTION W/PHACO: SHX586

## 2019-05-24 HISTORY — DX: Unspecified convulsions: R56.9

## 2019-05-24 HISTORY — DX: Gastro-esophageal reflux disease without esophagitis: K21.9

## 2019-05-24 HISTORY — DX: Unspecified osteoarthritis, unspecified site: M19.90

## 2019-05-24 HISTORY — DX: Sleep apnea, unspecified: G47.30

## 2019-05-24 LAB — POCT PREGNANCY, URINE: Preg Test, Ur: NEGATIVE

## 2019-05-24 SURGERY — PHACOEMULSIFICATION, CATARACT, WITH IOL INSERTION
Anesthesia: Monitor Anesthesia Care | Site: Eye | Laterality: Right

## 2019-05-24 MED ORDER — MIDAZOLAM HCL 2 MG/2ML IJ SOLN
INTRAMUSCULAR | Status: DC | PRN
  Administered 2019-05-24 (×2): .5 mg via INTRAVENOUS
  Administered 2019-05-24: 1 mg via INTRAVENOUS

## 2019-05-24 MED ORDER — SODIUM CHLORIDE 0.9 % IV SOLN: INTRAVENOUS | Status: DC

## 2019-05-24 MED ORDER — EPINEPHRINE PF 1 MG/ML IJ SOLN: INTRAOCULAR | Status: DC | PRN

## 2019-05-24 MED ORDER — POVIDONE-IODINE 5 % OP SOLN
OPHTHALMIC | Status: AC
  Filled 2019-05-24: qty 30

## 2019-05-24 MED ORDER — LIDOCAINE HCL (PF) 4 % IJ SOLN
INTRAMUSCULAR | Status: AC
  Filled 2019-05-24: qty 5

## 2019-05-24 MED ORDER — DEXMEDETOMIDINE HCL 200 MCG/2ML IV SOLN
INTRAVENOUS | Status: DC | PRN
  Administered 2019-05-24: 12 ug via INTRAVENOUS
  Administered 2019-05-24: 8 ug via INTRAVENOUS

## 2019-05-24 MED ORDER — EPINEPHRINE PF 1 MG/ML IJ SOLN
INTRAMUSCULAR | Status: AC
  Filled 2019-05-24: qty 1

## 2019-05-24 MED ORDER — POVIDONE-IODINE 5 % OP SOLN
OPHTHALMIC | Status: DC | PRN
  Administered 2019-05-24: 1 via OPHTHALMIC

## 2019-05-24 MED ORDER — MIDAZOLAM HCL 2 MG/2ML IJ SOLN
INTRAMUSCULAR | Status: AC
  Filled 2019-05-24: qty 2

## 2019-05-24 MED ORDER — PHENYLEPHRINE HCL 10 % OP SOLN
OPHTHALMIC | Status: AC
  Administered 2019-05-24: 1 [drp] via OPHTHALMIC
  Filled 2019-05-24: qty 5

## 2019-05-24 MED ORDER — MOXIFLOXACIN HCL 0.5 % OP SOLN
OPHTHALMIC | Status: AC
  Filled 2019-05-24: qty 3

## 2019-05-24 MED ORDER — DEXMEDETOMIDINE HCL IN NACL 80 MCG/20ML IV SOLN
INTRAVENOUS | Status: AC
  Filled 2019-05-24: qty 20

## 2019-05-24 MED ORDER — NA CHONDROIT SULF-NA HYALURON 40-17 MG/ML IO SOLN
INTRAOCULAR | Status: DC | PRN
  Administered 2019-05-24: 1 mL via INTRAOCULAR

## 2019-05-24 MED ORDER — TETRACAINE HCL 0.5 % OP SOLN
OPHTHALMIC | Status: AC
  Administered 2019-05-24: 1 [drp] via OPHTHALMIC
  Filled 2019-05-24: qty 4

## 2019-05-24 MED ORDER — EPINEPHRINE PF 1 MG/ML IJ SOLN
INTRAMUSCULAR | Status: AC
  Filled 2019-05-24: qty 2

## 2019-05-24 MED ORDER — ARMC OPHTHALMIC DILATING DROPS
1.0000 "application " | OPHTHALMIC | Status: AC
  Administered 2019-05-24 (×3): 1 via OPHTHALMIC

## 2019-05-24 MED ORDER — TOBRAMYCIN 0.3 % OP SOLN
OPHTHALMIC | Status: AC
  Filled 2019-05-24: qty 5

## 2019-05-24 MED ORDER — PHENYLEPHRINE HCL 10 % OP SOLN: 1.0000 [drp] | OPHTHALMIC | Status: DC | PRN

## 2019-05-24 MED ORDER — CARBACHOL 0.01 % IO SOLN
INTRAOCULAR | Status: DC | PRN
  Administered 2019-05-24: 0.5 mL via INTRAOCULAR

## 2019-05-24 MED ORDER — NA CHONDROIT SULF-NA HYALURON 40-17 MG/ML IO SOLN
INTRAOCULAR | Status: AC
  Filled 2019-05-24: qty 1

## 2019-05-24 MED ORDER — ARMC OPHTHALMIC DILATING DROPS
OPHTHALMIC | Status: AC
  Filled 2019-05-24: qty 0.5

## 2019-05-24 MED ORDER — ONDANSETRON HCL 4 MG/2ML IJ SOLN: 4.0000 mg | Freq: Once | INTRAMUSCULAR | Status: DC | PRN

## 2019-05-24 MED ORDER — TETRACAINE HCL 0.5 % OP SOLN: 1.0000 [drp] | OPHTHALMIC | Status: DC | PRN

## 2019-05-24 MED ORDER — LIDOCAINE HCL (PF) 4 % IJ SOLN
INTRAOCULAR | Status: DC | PRN
  Administered 2019-05-24: 4 mL

## 2019-05-24 MED ORDER — MOXIFLOXACIN HCL 0.5 % OP SOLN
1.0000 [drp] | Freq: Once | OPHTHALMIC | Status: AC
  Administered 2019-05-24: 1 [drp] via OPHTHALMIC

## 2019-05-24 SURGICAL SUPPLY — 16 items
GLOVE BIO SURGEON STRL SZ8 (GLOVE) ×3 IMPLANT
GLOVE BIOGEL M 6.5 STRL (GLOVE) ×3 IMPLANT
GLOVE SURG LX 8.0 MICRO (GLOVE) ×2
GLOVE SURG LX STRL 8.0 MICRO (GLOVE) ×1 IMPLANT
GOWN STRL REUS W/ TWL LRG LVL3 (GOWN DISPOSABLE) ×2 IMPLANT
GOWN STRL REUS W/TWL LRG LVL3 (GOWN DISPOSABLE) ×4
LABEL CATARACT MEDS ST (LABEL) ×3 IMPLANT
LENS IOL TECNIS ITEC 23.0 (Intraocular Lens) ×3 IMPLANT
PACK CATARACT (MISCELLANEOUS) ×3 IMPLANT
PACK CATARACT BRASINGTON LX (MISCELLANEOUS) ×3 IMPLANT
PACK EYE AFTER SURG (MISCELLANEOUS) ×3 IMPLANT
SOL BSS BAG (MISCELLANEOUS) ×3
SOLUTION BSS BAG (MISCELLANEOUS) ×1 IMPLANT
SYR 5ML LL (SYRINGE) ×3 IMPLANT
WATER STERILE IRR 250ML POUR (IV SOLUTION) ×3 IMPLANT
WIPE NON LINTING 3.25X3.25 (MISCELLANEOUS) ×3 IMPLANT

## 2019-05-24 NOTE — H&P (Signed)
All labs reviewed. Abnormal studies sent to patients PCP when indicated.  Previous H&P reviewed, patient examined, there are NO CHANGES.  Stacie Rothschild Porfilio3/12/20217:18 AM

## 2019-05-24 NOTE — Discharge Instructions (Addendum)
Eye Surgery Discharge Instructions  Expect mild scratchy sensation or mild soreness. DO NOT RUB YOUR EYE!  The day of surgery:  Minimal physical activity, but bed rest is not required  No reading, computer work, or close hand work  No bending, lifting, or straining.  May watch TV  For 24 hours:  No driving, legal decisions, or alcoholic beverages  Safety precautions  Eat anything you prefer: It is better to start with liquids, then soup then solid foods.  Solar shield eyeglasses should be worn for comfort in the sunlight/patch while sleeping  Resume all regular medications including aspirin or Coumadin if these were discontinued prior to surgery. You may shower, bathe, shave, or wash your hair. Tylenol may be taken for mild discomfort. Follow Dr. Inda Coke eye drop instruction sheet as reviewed.  Call your doctor if you experience significant pain, nausea, or vomiting, fever > 101 or other signs of infection. [phone] or [phone] Specific instructions:  Follow-up Information    Birder Robson, MD Follow up.   Specialty: Ophthalmology Why: 05/24/19 @ 1:45 pm Contact information: [address] [place] [address] [phone]

## 2019-05-24 NOTE — Anesthesia Postprocedure Evaluation (Signed)
Anesthesia Post Note  Patient: Stacie Shah  Procedure(s) Performed: CATARACT EXTRACTION PHACO AND INTRAOCULAR LENS PLACEMENT (IOC) RIGHT DIABETIC (Right Eye)  Patient location during evaluation: Phase II Anesthesia Type: MAC Level of consciousness: awake and alert Pain management: pain level controlled Vital Signs Assessment: post-procedure vital signs reviewed and stable Respiratory status: spontaneous breathing, nonlabored ventilation, respiratory function stable and patient connected to nasal cannula oxygen Cardiovascular status: blood pressure returned to baseline and stable Postop Assessment: no apparent nausea or vomiting Anesthetic complications: no     Last Vitals:  Vitals:   05/24/19 0615 05/24/19 0757  BP: 129/76 105/61  Pulse: 72 64  Resp: 16 16  Temp: 36.9 C 36.6 C  SpO2: 98% 98%    Last Pain:  Vitals:   05/24/19 0757  TempSrc: Temporal  PainSc: 0-No pain                 Arita Miss

## 2019-05-24 NOTE — Anesthesia Preprocedure Evaluation (Signed)
Anesthesia Evaluation  Patient identified by MRN, date of birth, ID band Patient awake    Reviewed: Allergy & Precautions, NPO status , Patient's Chart, lab work & pertinent test results  History of Anesthesia Complications Negative for: history of anesthetic complications  Airway Mallampati: III  TM Distance: >3 FB Neck ROM: Full    Dental no notable dental hx. (+) Teeth Intact   Pulmonary neg pulmonary ROS, asthma , sleep apnea , neg COPD, Patient abstained from smoking.Not current smoker,  Mild asthma, can go months without any inhaler use. OSA recently diagnosed, in process of obtaining a CPAP   Pulmonary exam normal breath sounds clear to auscultation       Cardiovascular Exercise Tolerance: Good METShypertension, Pt. on medications (-) CAD and (-) Past MI (-) dysrhythmias  Rhythm:Regular Rate:Normal - Systolic murmurs CHF listed in chart but echo in 2014 was unremarkable   Neuro/Psych PSYCHIATRIC DISORDERS Depression negative neurological ROS  negative psych ROS   GI/Hepatic GERD  Controlled,(+)     (-) substance abuse  ,   Endo/Other  neg diabetesMorbid obesity  Renal/GU negative Renal ROS     Musculoskeletal   Abdominal (+) + obese,   Peds  Hematology   Anesthesia Other Findings Past Medical History: No date: Arthritis No date: Asthma No date: CHF (congestive heart failure) (H[facility]) No date: Depression No date: GERD (gastroesophageal reflux disease) No date: Hypercholesteremia No date: Hypertension No date: Seizure ([place])     Comment:  as a child No date: Sleep apnea  Reproductive/Obstetrics                             Anesthesia Physical Anesthesia Plan  ASA: III  Anesthesia Plan: MAC   Post-op Pain Management:    Induction: Intravenous  PONV Risk Score and Plan: 2 and Midazolam and TIVA  Airway Management Planned: Nasal Cannula  Additional Equipment:    Intra-op Plan:   Post-operative Plan:   Informed Consent: I have reviewed the patients History and Physical, chart, labs and discussed the procedure including the risks, benefits and alternatives for the proposed anesthesia with the patient or authorized representative who has indicated his/her understanding and acceptance.       Plan Discussed with: CRNA and Surgeon  Anesthesia Plan Comments: (Explained risks of anesthesia, including PONV, and rare emergencies requiring invasive intervention. Patient understands. Patient informed about increased incidence of above perioperative risk due to high BMI. Patient understands.  )        Anesthesia Quick Evaluation

## 2019-05-24 NOTE — Op Note (Signed)
PREOPERATIVE DIAGNOSIS:  Nuclear sclerotic cataract of the right eye.   POSTOPERATIVE DIAGNOSIS:  NUCLEAR SCLORTIC CATARACT RIGHT EYE   OPERATIVE PROCEDURE: Procedure(s): CATARACT EXTRACTION PHACO AND INTRAOCULAR LENS PLACEMENT ([place]) RIGHT DIABETIC   SURGEON:  Birder Robson, MD.   ANESTHESIA:  Anesthesiologist: Arita Miss, MD CRNA: Hedda Slade, CRNA  1.      Managed anesthesia care. 2.      0.21ml of Shugarcaine was instilled in the eye following the paracentesis.   COMPLICATIONS:  None.   TECHNIQUE:   Stop and chop   DESCRIPTION OF PROCEDURE:  The patient was examined and consented in the preoperative holding area where the aforementioned topical anesthesia was applied to the right eye and then brought back to the Operating Room where the right eye was prepped and draped in the usual sterile ophthalmic fashion and a lid speculum was placed. A paracentesis was created with the side port blade and the anterior chamber was filled with viscoelastic. A near clear corneal incision was performed with the steel keratome. A continuous curvilinear capsulorrhexis was performed with a cystotome followed by the capsulorrhexis forceps. Hydrodissection and hydrodelineation were carried out with BSS on a blunt cannula. The lens was removed in a stop and chop  technique and the remaining cortical material was removed with the irrigation-aspiration handpiece. The capsular bag was inflated with viscoelastic and the Technis [id number]  lens was placed in the capsular bag without complication. The remaining viscoelastic was removed from the eye with the irrigation-aspiration handpiece. The wounds were hydrated. The anterior chamber was flushed with Miostat and the eye was inflated to physiologic pressure. 0.15ml of Vigamox was placed in the anterior chamber. The wounds were found to be water tight. The eye was dressed with Vigamox. The patient was given protective glasses to wear throughout the day and a shield with  which to sleep tonight. The patient was also given drops with which to begin a drop regimen today and will follow-up with me in one day. Implant Name Type Inv. Item Serial No. Manufacturer Lot No. LRB No. Used Action  LENS IOL DIOP 23.0 - [id number] 2002 Intraocular Lens LENS IOL DIOP 23.0 [id number] 2002 [place]  Right 1 Implanted   Procedure(s) with comments: CATARACT EXTRACTION PHACO AND INTRAOCULAR LENS PLACEMENT (IOC) RIGHT DIABETIC (Right) - [place] 00:23.6 CDE 1.88 Fluid Pack Lot # [id number]  Electronically signed: Birder Robson 05/24/2019 7:56 AM

## 2019-05-24 NOTE — Transfer of Care (Signed)
Immediate Anesthesia Transfer of Care Note  Patient: Stacie Shah  Procedure(s) Performed: CATARACT EXTRACTION PHACO AND INTRAOCULAR LENS PLACEMENT (IOC) RIGHT DIABETIC (Right Eye)  Patient Location: PACU  Anesthesia Type:MAC  Level of Consciousness: awake, alert  and oriented  Airway & Oxygen Therapy: Patient Spontanous Breathing  Post-op Assessment: Report given to RN and Post -op Vital signs reviewed and stable  Post vital signs: Reviewed and stable  Last Vitals:  Vitals Value Taken Time  BP    Temp    Pulse    Resp    SpO2      Last Pain:  Vitals:   05/24/19 0615  TempSrc: Tympanic  PainSc: 0-No pain         Complications: No apparent anesthesia complications

## 2019-05-29 ENCOUNTER — Ambulatory Visit: Payer: Medicaid Other | Attending: Internal Medicine

## 2019-06-20 DIAGNOSIS — R569 Unspecified convulsions: Secondary | ICD-10-CM | POA: Insufficient documentation

## 2019-06-27 ENCOUNTER — Other Ambulatory Visit: Payer: Self-pay | Admitting: Acute Care

## 2019-06-27 DIAGNOSIS — I639 Cerebral infarction, unspecified: Secondary | ICD-10-CM

## 2019-07-11 ENCOUNTER — Other Ambulatory Visit: Payer: Self-pay

## 2019-07-11 ENCOUNTER — Ambulatory Visit
Admission: RE | Admit: 2019-07-11 | Discharge: 2019-07-11 | Disposition: A | Discharge status: Home / Self Care | Payer: Medicaid Other | Source: Ambulatory Visit | Attending: Acute Care | Admitting: Acute Care

## 2019-07-11 DIAGNOSIS — I639 Cerebral infarction, unspecified: Secondary | ICD-10-CM | POA: Insufficient documentation

## 2019-07-23 ENCOUNTER — Other Ambulatory Visit: Payer: Self-pay | Admitting: General Surgery

## 2019-07-23 DIAGNOSIS — D171 Benign lipomatous neoplasm of skin and subcutaneous tissue of trunk: Secondary | ICD-10-CM

## 2019-08-01 ENCOUNTER — Ambulatory Visit: Payer: Medicaid Other

## 2019-08-14 ENCOUNTER — Ambulatory Visit: Payer: Medicaid Other

## 2019-08-26 ENCOUNTER — Ambulatory Visit: Admission: RE | Admit: 2019-08-26 | Payer: Medicaid Other | Source: Ambulatory Visit

## 2019-08-26 ENCOUNTER — Other Ambulatory Visit: Payer: Self-pay | Admitting: General Surgery

## 2019-08-26 ENCOUNTER — Other Ambulatory Visit: Payer: Self-pay

## 2019-08-26 ENCOUNTER — Ambulatory Visit
Admission: RE | Admit: 2019-08-26 | Discharge: 2019-08-26 | Disposition: A | Discharge status: Home / Self Care | Payer: Medicaid Other | Source: Ambulatory Visit | Attending: General Surgery | Admitting: General Surgery

## 2019-08-26 DIAGNOSIS — D171 Benign lipomatous neoplasm of skin and subcutaneous tissue of trunk: Secondary | ICD-10-CM | POA: Diagnosis not present

## 2019-08-26 MED ORDER — GADOBUTROL 1 MMOL/ML IV SOLN
10.0000 mL | Freq: Once | INTRAVENOUS | Status: AC | PRN
  Administered 2019-08-26: 10 mL via INTRAVENOUS

## 2021-07-25 ENCOUNTER — Other Ambulatory Visit: Payer: Self-pay

## 2021-07-25 ENCOUNTER — Inpatient Hospital Stay
Admission: EM | Admit: 2021-07-25 | Discharge: 2021-07-28 | DRG: 291 | Disposition: A | Discharge status: Home / Self Care | Payer: Medicaid Other | Attending: Internal Medicine | Admitting: Internal Medicine

## 2021-07-25 ENCOUNTER — Encounter: Payer: Self-pay | Admitting: Emergency Medicine

## 2021-07-25 ENCOUNTER — Emergency Department: Payer: Medicaid Other

## 2021-07-25 DIAGNOSIS — F32A Depression, unspecified: Secondary | ICD-10-CM | POA: Diagnosis present

## 2021-07-25 DIAGNOSIS — R0602 Shortness of breath: Secondary | ICD-10-CM

## 2021-07-25 DIAGNOSIS — E785 Hyperlipidemia, unspecified: Secondary | ICD-10-CM | POA: Diagnosis not present

## 2021-07-25 DIAGNOSIS — Z9884 Bariatric surgery status: Secondary | ICD-10-CM

## 2021-07-25 DIAGNOSIS — Z9841 Cataract extraction status, right eye: Secondary | ICD-10-CM

## 2021-07-25 DIAGNOSIS — M1990 Unspecified osteoarthritis, unspecified site: Secondary | ICD-10-CM | POA: Diagnosis present

## 2021-07-25 DIAGNOSIS — K219 Gastro-esophageal reflux disease without esophagitis: Secondary | ICD-10-CM

## 2021-07-25 DIAGNOSIS — I482 Chronic atrial fibrillation, unspecified: Secondary | ICD-10-CM | POA: Diagnosis present

## 2021-07-25 DIAGNOSIS — Z79899 Other long term (current) drug therapy: Secondary | ICD-10-CM | POA: Diagnosis not present

## 2021-07-25 DIAGNOSIS — Z91199 Patient's noncompliance with other medical treatment and regimen due to unspecified reason: Secondary | ICD-10-CM | POA: Diagnosis not present

## 2021-07-25 DIAGNOSIS — Z20822 Contact with and (suspected) exposure to covid-19: Secondary | ICD-10-CM | POA: Diagnosis present

## 2021-07-25 DIAGNOSIS — I110 Hypertensive heart disease with heart failure: Secondary | ICD-10-CM | POA: Diagnosis present

## 2021-07-25 DIAGNOSIS — J45909 Unspecified asthma, uncomplicated: Secondary | ICD-10-CM | POA: Diagnosis present

## 2021-07-25 DIAGNOSIS — R000 Tachycardia, unspecified: Secondary | ICD-10-CM | POA: Diagnosis present

## 2021-07-25 DIAGNOSIS — I5033 Acute on chronic diastolic (congestive) heart failure: Secondary | ICD-10-CM | POA: Diagnosis present

## 2021-07-25 DIAGNOSIS — G4733 Obstructive sleep apnea (adult) (pediatric): Secondary | ICD-10-CM | POA: Diagnosis present

## 2021-07-25 DIAGNOSIS — R569 Unspecified convulsions: Secondary | ICD-10-CM | POA: Diagnosis not present

## 2021-07-25 DIAGNOSIS — Z9049 Acquired absence of other specified parts of digestive tract: Secondary | ICD-10-CM

## 2021-07-25 DIAGNOSIS — I4892 Unspecified atrial flutter: Secondary | ICD-10-CM | POA: Diagnosis present

## 2021-07-25 DIAGNOSIS — I959 Hypotension, unspecified: Secondary | ICD-10-CM | POA: Diagnosis present

## 2021-07-25 DIAGNOSIS — Z8673 Personal history of transient ischemic attack (TIA), and cerebral infarction without residual deficits: Secondary | ICD-10-CM

## 2021-07-25 DIAGNOSIS — Z881 Allergy status to other antibiotic agents status: Secondary | ICD-10-CM | POA: Diagnosis not present

## 2021-07-25 DIAGNOSIS — R0682 Tachypnea, not elsewhere classified: Secondary | ICD-10-CM | POA: Diagnosis present

## 2021-07-25 DIAGNOSIS — I10 Essential (primary) hypertension: Secondary | ICD-10-CM

## 2021-07-25 DIAGNOSIS — G40909 Epilepsy, unspecified, not intractable, without status epilepticus: Secondary | ICD-10-CM | POA: Diagnosis present

## 2021-07-25 DIAGNOSIS — I480 Paroxysmal atrial fibrillation: Secondary | ICD-10-CM | POA: Diagnosis not present

## 2021-07-25 DIAGNOSIS — Z961 Presence of intraocular lens: Secondary | ICD-10-CM | POA: Diagnosis present

## 2021-07-25 DIAGNOSIS — I4891 Unspecified atrial fibrillation: Secondary | ICD-10-CM | POA: Diagnosis not present

## 2021-07-25 DIAGNOSIS — I509 Heart failure, unspecified: Secondary | ICD-10-CM

## 2021-07-25 DIAGNOSIS — R7303 Prediabetes: Secondary | ICD-10-CM | POA: Diagnosis present

## 2021-07-25 DIAGNOSIS — I493 Ventricular premature depolarization: Secondary | ICD-10-CM | POA: Diagnosis present

## 2021-07-25 DIAGNOSIS — Z6841 Body Mass Index (BMI) 40.0 and over, adult: Secondary | ICD-10-CM

## 2021-07-25 DIAGNOSIS — E7800 Pure hypercholesterolemia, unspecified: Secondary | ICD-10-CM | POA: Diagnosis present

## 2021-07-25 LAB — CBC
HCT: 42.8 % (ref 36.0–46.0)
Hemoglobin: 14 g/dL (ref 12.0–15.0)
MCH: 31 pg (ref 26.0–34.0)
MCHC: 32.7 g/dL (ref 30.0–36.0)
MCV: 94.9 fL (ref 80.0–100.0)
Platelets: 328 10*3/uL (ref 150–400)
RBC: 4.51 MIL/uL (ref 3.87–5.11)
RDW: 12.3 % (ref 11.5–15.5)
WBC: 8.7 10*3/uL (ref 4.0–10.5)
nRBC: 0 % (ref 0.0–0.2)

## 2021-07-25 LAB — URINALYSIS, ROUTINE W REFLEX MICROSCOPIC
Bilirubin Urine: NEGATIVE
Glucose, UA: NEGATIVE mg/dL
Ketones, ur: NEGATIVE mg/dL
Leukocytes,Ua: NEGATIVE
Nitrite: NEGATIVE
Protein, ur: 100 mg/dL — AB
Specific Gravity, Urine: 1.03 (ref 1.005–1.030)
pH: 6 (ref 5.0–8.0)

## 2021-07-25 LAB — BASIC METABOLIC PANEL
Anion gap: 7 (ref 5–15)
BUN: 16 mg/dL (ref 6–20)
CO2: 24 mmol/L (ref 22–32)
Calcium: 8.7 mg/dL — ABNORMAL LOW (ref 8.9–10.3)
Chloride: 104 mmol/L (ref 98–111)
Creatinine, Ser: 0.99 mg/dL (ref 0.44–1.00)
GFR, Estimated: >60 mL/min (ref >60)
Glucose, Bld: 135 mg/dL — ABNORMAL HIGH (ref 70–99)
Potassium: 4.2 mmol/L (ref 3.5–5.1)
Sodium: 135 mmol/L (ref 135–145)

## 2021-07-25 LAB — RESP PANEL BY RT-PCR (FLU A&B, COVID) ARPGX2
Influenza A by PCR: NEGATIVE
Influenza B by PCR: NEGATIVE
SARS Coronavirus 2 by RT PCR: NEGATIVE

## 2021-07-25 LAB — TROPONIN I (HIGH SENSITIVITY)
Troponin I (High Sensitivity): 19 ng/L — ABNORMAL HIGH (ref <18)
Troponin I (High Sensitivity): 19 ng/L — ABNORMAL HIGH (ref <18)

## 2021-07-25 LAB — BRAIN NATRIURETIC PEPTIDE: B Natriuretic Peptide: 283.8 pg/mL — ABNORMAL HIGH (ref 0.0–100.0)

## 2021-07-25 MED ORDER — NORETHINDRONE ACETATE 5 MG PO TABS
15.0000 mg | ORAL_TABLET | Freq: Once | ORAL | Status: AC
  Administered 2021-07-25: 15 mg via ORAL
  Filled 2021-07-25 (×2): qty 3

## 2021-07-25 MED ORDER — DULOXETINE HCL 30 MG PO CPEP
60.0000 mg | ORAL_CAPSULE | Freq: Two times a day (BID) | ORAL | Status: DC
  Administered 2021-07-25 – 2021-07-28 (×6): 60 mg via ORAL
  Filled 2021-07-25 (×6): qty 2

## 2021-07-25 MED ORDER — TRAZODONE HCL 50 MG PO TABS
150.0000 mg | ORAL_TABLET | Freq: Every day | ORAL | Status: DC
  Administered 2021-07-25 – 2021-07-27 (×3): 150 mg via ORAL
  Filled 2021-07-25 (×3): qty 1

## 2021-07-25 MED ORDER — ATORVASTATIN CALCIUM 10 MG PO TABS
10.0000 mg | ORAL_TABLET | Freq: Every day | ORAL | Status: DC
  Administered 2021-07-26 – 2021-07-28 (×3): 10 mg via ORAL
  Filled 2021-07-25 (×3): qty 1

## 2021-07-25 MED ORDER — METOPROLOL TARTRATE 5 MG/5ML IV SOLN
5.0000 mg | Freq: Once | INTRAVENOUS | Status: AC
  Administered 2021-07-25: 5 mg via INTRAVENOUS
  Filled 2021-07-25: qty 5

## 2021-07-25 MED ORDER — ONDANSETRON HCL 4 MG/2ML IJ SOLN: 4.0000 mg | Freq: Four times a day (QID) | INTRAMUSCULAR | Status: DC | PRN

## 2021-07-25 MED ORDER — ENOXAPARIN SODIUM 40 MG/0.4ML IJ SOSY: 40.0000 mg | PREFILLED_SYRINGE | INTRAMUSCULAR | Status: DC

## 2021-07-25 MED ORDER — DILTIAZEM HCL-DEXTROSE 125-5 MG/125ML-% IV SOLN (PREMIX)
5.0000 mg/h | INTRAVENOUS | Status: DC
  Administered 2021-07-25: 5 mg/h via INTRAVENOUS
  Filled 2021-07-25: qty 125

## 2021-07-25 MED ORDER — FUROSEMIDE 10 MG/ML IJ SOLN
40.0000 mg | Freq: Four times a day (QID) | INTRAMUSCULAR | Status: AC
  Administered 2021-07-25 – 2021-07-26 (×3): 40 mg via INTRAVENOUS
  Filled 2021-07-25 (×3): qty 4

## 2021-07-25 MED ORDER — TOPIRAMATE 25 MG PO TABS: 100.0000 mg | ORAL_TABLET | Freq: Every day | ORAL | Status: DC

## 2021-07-25 MED ORDER — ALBUTEROL SULFATE (2.5 MG/3ML) 0.083% IN NEBU: 3.0000 mL | INHALATION_SOLUTION | RESPIRATORY_TRACT | Status: DC | PRN

## 2021-07-25 MED ORDER — SODIUM CHLORIDE 0.9% FLUSH: 3.0000 mL | INTRAVENOUS | Status: DC | PRN

## 2021-07-25 MED ORDER — SODIUM CHLORIDE 0.9 % IV SOLN: 250.0000 mL | INTRAVENOUS | Status: DC | PRN

## 2021-07-25 MED ORDER — SODIUM CHLORIDE 0.9% FLUSH
3.0000 mL | Freq: Two times a day (BID) | INTRAVENOUS | Status: DC
  Administered 2021-07-25 – 2021-07-26 (×2): 3 mL via INTRAVENOUS

## 2021-07-25 MED ORDER — LISINOPRIL 10 MG PO TABS
10.0000 mg | ORAL_TABLET | Freq: Every day | ORAL | Status: DC
  Administered 2021-07-25 – 2021-07-26 (×2): 10 mg via ORAL
  Filled 2021-07-25 (×2): qty 1

## 2021-07-25 MED ORDER — FUROSEMIDE 10 MG/ML IJ SOLN
40.0000 mg | Freq: Once | INTRAMUSCULAR | Status: AC
  Administered 2021-07-25: 40 mg via INTRAVENOUS
  Filled 2021-07-25: qty 4

## 2021-07-25 MED ORDER — DILTIAZEM LOAD VIA INFUSION
10.0000 mg | Freq: Once | INTRAVENOUS | Status: AC
Start: 2021-07-25 — End: 2021-07-25
  Administered 2021-07-25: 10 mg via INTRAVENOUS
  Filled 2021-07-25: qty 10

## 2021-07-25 MED ORDER — IOHEXOL 350 MG/ML SOLN
100.0000 mL | Freq: Once | INTRAVENOUS | Status: AC | PRN
  Administered 2021-07-25: 100 mL via INTRAVENOUS

## 2021-07-25 MED ORDER — ACETAMINOPHEN 325 MG PO TABS: 650.0000 mg | ORAL_TABLET | ORAL | Status: DC | PRN

## 2021-07-25 MED ORDER — PANTOPRAZOLE SODIUM 40 MG PO TBEC
40.0000 mg | DELAYED_RELEASE_TABLET | Freq: Every day | ORAL | Status: DC
  Administered 2021-07-26 – 2021-07-28 (×3): 40 mg via ORAL
  Filled 2021-07-25 (×3): qty 1

## 2021-07-25 MED ORDER — ENOXAPARIN SODIUM 100 MG/ML IJ SOSY
0.5000 mg/kg | PREFILLED_SYRINGE | INTRAMUSCULAR | Status: DC
  Administered 2021-07-25: 90 mg via SUBCUTANEOUS
  Filled 2021-07-25 (×2): qty 0.9

## 2021-07-25 NOTE — Assessment & Plan Note (Signed)
Childhood seizures. NO seizure in many years. No treatment.  ?

## 2021-07-25 NOTE — Assessment & Plan Note (Signed)
-   Continue home meds °

## 2021-07-25 NOTE — ED Provider Notes (Signed)
? ?[facility] ?Provider Note ? ? ? Event Date/Time  ? First MD Initiated Contact with Patient 07/25/21 1250   ?  (approximate) ? ? ?History  ? ?Chest Pain, Shortness of Breath, and Abdominal Pain ? ? ?HPI ? ?Stacie Shah is a 51 y.o. female who presents to the ED for evaluation of Chest Pain, Shortness of Breath, and Abdominal Pain ?  ?I had outpatient PCP visit from 4/11.  Morbidly obese patient with history of metabolic syndrome.  Diastolic dysfunction. ? ?Patient reports about 1 week of worsening shortness of breath, orthopnea and associated chest tightness.  She reports that she has not been on her furosemide for about 10 or 12 years as her respiratory status has been well controlled. ? ?Reports feeling more swollen she reports orthopnea, she reports developing increased chest tightness sensation in the past day, mostly positional with supine positioning. ? ?Denies fevers, cough, syncope, emesis. ? ?Does reports lower abdominal cramping discomfort and dark-colored urine without dysuria, hematuria or diarrhea. ? ? ?Physical Exam  ? ?Triage Vital Signs: ?ED Triage Vitals  ?Enc Vitals Group  ?   BP 07/25/21 1129 (!) 125/95  ?   Pulse Rate 07/25/21 1129 (!) 56  ?   Resp 07/25/21 1129 20  ?   Temp 07/25/21 1129 98 ?F (36.7 ?C)  ?   Temp Source 07/25/21 1129 Oral  ?   SpO2 07/25/21 1129 94 %  ?   Weight --   ?   Height --   ?   Head Circumference --   ?   Peak Flow --   ?   Pain Score 07/25/21 1116 10  ?   Pain Loc --   ?   Pain Edu? --   ?   Excl. in [place]? --   ? ? ?Most recent vital signs: ?Vitals:  ? 07/25/21 1330 07/25/21 1430  ?BP: (!) 138/109 105/87  ?Pulse: 100 (!) 105  ?Resp: (!) 28 (!) 29  ?Temp:    ?SpO2: 93% 95%  ? ? ?General: Awake, no distress.  Conversational full sentences on room air.  Does seem dyspneic though with conversation ?CV:  Good peripheral perfusion.  Tachycardic and regular. ?Resp:  Tachypneic to about 30.  Bibasilar crackles are present. ?Abd:  No distention.  Soft  and benign throughout. ?MSK:  No deformity noted.  Trace pitting edema to bilateral lower extremities ?Neuro:  No focal deficits appreciated. ?Other:   ? ? ?ED Results / Procedures / Treatments  ? ?Labs ?(all labs ordered are listed, but only abnormal results are displayed) ?Labs Reviewed  ?BASIC METABOLIC PANEL - Abnormal; Notable for the following components:  ?    Result Value  ? Glucose, Bld 135 (*)   ? Calcium 8.7 (*)   ? All other components within normal limits  ?BRAIN NATRIURETIC PEPTIDE - Abnormal; Notable for the following components:  ? B Natriuretic Peptide 283.8 (*)   ? All other components within normal limits  ?TROPONIN I (HIGH SENSITIVITY) - Abnormal; Notable for the following components:  ? Troponin I (High Sensitivity) 19 (*)   ? All other components within normal limits  ?TROPONIN I (HIGH SENSITIVITY) - Abnormal; Notable for the following components:  ? Troponin I (High Sensitivity) 19 (*)   ? All other components within normal limits  ?RESP PANEL BY RT-PCR (FLU A&B, COVID) ARPGX2  ?CBC  ?URINALYSIS, ROUTINE W REFLEX MICROSCOPIC  ? ? ?EKG ?Sinus tachycardia with a rate of 104 BPM, rightward  axis, normal intervals.  Couple PVCs.  No evidence of acute ischemia. ? ?RADIOLOGY ?2 view CXR reviewed by me with pulmonary vascular congestion and multiple nodular areas ? ?Official radiology report(s): ?DG Chest 2 View ? ?Result Date: 07/25/2021 ?CLINICAL DATA:  sob EXAM: CHEST - 2 VIEW COMPARISON:  Chest radiograph dated January 13, 2013 FINDINGS: The cardiomediastinal silhouette is enlarged in contour. No pleural effusion. No pneumothorax. There are diffuse interstitial opacities with pulmonary vascular congestion and peribronchial cuffing. There are several more focal consolidative opacities noted early. Visualized abdomen is unremarkable. Multilevel degenerative changes of the thoracic spine. IMPRESSION: 1. Constellation of findings likely reflect pulmonary edema with areas of confluent atelectasis given  patient history. Given nodular appearance of several of these areas, recommend follow-up PA and lateral radiograph 3-4 weeks after appropriate treatment to assess for resolution and exclude underlying malignancy. Electronically Signed   By: Valentino Saxon M.D.   On: 07/25/2021 12:00  ? ?CT Angio Chest PE W and/or Wo Contrast ? ?Result Date: 07/25/2021 ?CLINICAL DATA:  Nonradiating chest pain and chest heaviness, shortness of breath for several weeks EXAM: CT ANGIOGRAPHY CHEST WITH CONTRAST TECHNIQUE: Multidetector CT imaging of the chest was performed using the standard protocol during bolus administration of intravenous contrast. Multiplanar CT image reconstructions and MIPs were obtained to evaluate the vascular anatomy. RADIATION DOSE REDUCTION: This exam was performed according to the departmental dose-optimization program which includes automated exposure control, adjustment of the mA and/or kV according to patient size and/or use of iterative reconstruction technique. CONTRAST:  123m OMNIPAQUE IOHEXOL 350 MG/ML SOLN COMPARISON:  07/25/2021 FINDINGS: Cardiovascular: This is a technically adequate evaluation of the pulmonary vasculature. No filling defects or pulmonary emboli. Heart is unremarkable without pericardial effusion. Normal caliber of the thoracic aorta. Mediastinum/Nodes: No enlarged mediastinal, hilar, or axillary lymph nodes. Thyroid gland, trachea, and esophagus demonstrate no significant findings. Lungs/Pleura: Small bilateral pleural effusions. There is bilateral perihilar airspace disease, most pronounced within the right middle and bilateral lower lobes. Basilar predominant interlobular septal thickening. Overall, findings favor congestive heart failure over multifocal pneumonia. No pneumothorax. Central airways are patent. Upper Abdomen: Postsurgical changes from cholecystectomy and bariatric surgery. No acute upper abdominal findings. Musculoskeletal: No acute or destructive bony  lesions. Reconstructed images demonstrate no additional findings. Review of the MIP images confirms the above findings. IMPRESSION: 1. No evidence of pulmonary embolus. 2. Bilateral airspace disease, basilar predominant septal thickening, and bilateral pleural effusions most consistent with congestive heart failure. Electronically Signed   By: MRanda NgoM.D.   On: 07/25/2021 15:04   ? ?PROCEDURES and INTERVENTIONS: ? ?.1-3 Lead EKG Interpretation ?Performed by: SVladimir Crofts MD ?Authorized by: SVladimir Crofts MD  ? ?  Interpretation: abnormal   ?  ECG rate:  104 ?  ECG rate assessment: tachycardic   ?  Rhythm: sinus tachycardia   ?  Ectopy: none   ?  Conduction: normal   ? ?Medications  ?furosemide (LASIX) injection 40 mg (has no administration in time range)  ?iohexol (OMNIPAQUE) 350 MG/ML injection 100 mL (100 mLs Intravenous Contrast Given 07/25/21 1452)  ? ? ? ?IMPRESSION / MDM / ASSESSMENT AND PLAN / ED COURSE  ?I reviewed the triage vital signs and the nursing notes. ? ?51year old female presents to the ED with evidence of symptomatic CHF requiring medical observation admission.  She is tachycardic, tachypneic and appears uncomfortable, but not hypoxic.  Indications for BiPAP.  CXR is congested and she looks volume overloaded on exam.  Blood work with  elevated BNP.  Normal CBC and metabolic panel.  Lance Bosch is just marginally elevated and likely due to her respiratory status, but we will trend these.  Testing negative for flu and COVID.  CTA chest obtained due to her tachycardia, chest pain and the fact that she has not dealt with CHF in many years.  This is without evidence of mass, PTX or PE.  We will initiate diuresis in the ED and consult with medicine for admission. ? ?Clinical Course as of 07/25/21 1514  ?Sun Jul 25, 2021  ?1419 12 years without CHF , no longer on diuretics [DS]  ?  ?Clinical Course User Index ?[DS] Vladimir Crofts, MD  ? ? ? ?FINAL CLINICAL IMPRESSION(S) / ED DIAGNOSES  ? ?Final diagnoses:   ?Acute on chronic diastolic congestive heart failure ([place])  ?Shortness of breath  ? ? ? ?Rx / DC Orders  ? ?ED Discharge Orders   ? ? None  ? ?  ? ? ? ?Note:  This document was prepared using Dragon voice r

## 2021-07-25 NOTE — ED Notes (Signed)
Notified MD of elevated BP. See MAR for medications given.  ? ?

## 2021-07-25 NOTE — Assessment & Plan Note (Addendum)
While in ED patient developed tachycardia to 170. EKG revealed a. Fib with RVR. ED-MD order metoprolol which did not control rate. ?-- Patient was on amiodarone now switch to oral amiodarone per cardiology. ?-- Eliquis started ?-- tolerating beta-blockers. ?-- Patient will follow-up with Dr. Nehemiah Massed ? ?

## 2021-07-25 NOTE — Assessment & Plan Note (Signed)
Continue PPI ?

## 2021-07-25 NOTE — H&P (Signed)
?History and Physical  ? ? ?Stacie Shah ZOX:[id number] DOB: 28-Aug-1970 DOA: 07/25/2021 ? ?DOS: the patient was seen and examined on 07/25/2021 ? ?PCP: [facility]  ? ?Patient coming from: Home ? ?I have personally briefly reviewed patient's old medical records in [place] ? 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 She also has metorrhagia for which she is to have a hysterectomy but is currently medically managed. She reports that she had increased SOB/DOE along with chest pressure and some abdominal pain. She presented to [facility]ED for evaluation.  ? ?ED Course: Vitals reviewed. Per EDP exam patient was tachypneic, had bibasilar rales. Lab revealed glucose of 135, BNP 283, Troponin 19, CBC nl. CXR c/w pulmonary edema. CTA negative for PE but c/w pulmonary edema. She was given IV lasix. TRH called to admit for continued management.  ? ?Review of Systems:  ?Review of Systems  ?Constitutional:  Negative for chills, diaphoresis and fever.  ?HENT: Negative.    ?Eyes: Negative.   ?Respiratory:  Positive for cough and shortness of breath.   ?Cardiovascular:  Positive for orthopnea, leg swelling and PND. Negative for chest pain.  ?Gastrointestinal: Negative.   ?Genitourinary: Negative.   ?Musculoskeletal: Negative.   ?Skin: Negative.   ?Neurological: Negative.   ?Psychiatric/Behavioral: Negative.    ? ?Past Medical History:  ?Diagnosis Date  ? Arthritis   ? Asthma   ? CHF (congestive heart failure) ([place])   ? Depression   ? GERD (gastroesophageal reflux disease)   ? Hypercholesteremia   ? Hypertension   ? Seizure ([place])   ? as a child  ? Sleep apnea   ? ? ?Past Surgical History:  ?Procedure Laterality Date  ? ABDOMINAL SURGERY    ? Gastric Sleeve  ? CATARACT EXTRACTION W/PHACO Right 05/24/2019   ? Procedure: CATARACT EXTRACTION PHACO AND INTRAOCULAR LENS PLACEMENT ([place]) RIGHT DIABETIC;  Surgeon: Birder Robson, MD;  Location: [facility] ORS;  Service: Ophthalmology;  Laterality: Right;  [place] 00:23.6 ?CDE 1.88 ?Fluid Pack Lot # [id number]  ? CHOLECYSTECTOMY    ? FOOT SURGERY    ? cyst removal from heel  ? REPEAT CESAREAN SECTION    ? c-section x3  ? TYMPANOSTOMY    ? ?Soc Hx - divorced. Has 4 children ages 16, 13, 35, 39. She has her youngest living with her. She is on disability 2/2 multiple co-morbidities.  ? ? reports that she has never smoked. She has never used smokeless tobacco. She reports current alcohol use. She reports that she does not use drugs. ? ?Allergies  ?Allergen Reactions  ? Sulfa Antibiotics Rash  ? ? ?No family history on file. ? ?Prior to Admission medications   ?Medication Sig Start Date End Date Taking? Authorizing Provider  ?albuterol (VENTOLIN HFA) 108 (90 Base) MCG/ACT inhaler Inhale 2 puffs into the lungs every 4 (four) hours as needed for wheezing or shortness of breath.    [provider]  ?atorvastatin (LIPITOR) 10 MG tablet Take 10 mg by mouth daily.    [provider]  ?DULoxetine (CYMBALTA) 60 MG capsule Take 60 mg by mouth 2 (two) times daily.     [provider]  ?fluticasone (FLONASE) 50 MCG/ACT nasal spray Place 2 sprays into both nostrils daily as needed.  [provider]  ?furosemide (LASIX) 40 MG tablet Take 40 mg by mouth as needed.    [provider]  ?lisinopril (ZESTRIL) 10 MG tablet Take 10 mg by mouth daily.     [provider]  ?omeprazole (PRILOSEC) 20 MG capsule Take 20 mg by mouth daily. 08/31/15   [provider]  ?topiramate (TOPAMAX) 100 MG tablet Take 100 mg by mouth daily.    [provider]  ?traZODone (DESYREL) 150 MG tablet Take 150 mg by mouth at bedtime.     [provider]  ? ? ?Physical Exam: ?Vitals:  ? 07/25/21 1300 07/25/21 1330 07/25/21 1430 07/25/21 1600  ?BP: (!)  125/93 (!) 138/109 105/87 (!) 159/106  ?Pulse: (!) 105 100 (!) 105 100  ?Resp: (!) 25 (!) 28 (!) 29 (!) 36  ?Temp:      ?TempSrc:      ?SpO2: 93% 93% 95% 94%  ? ? ?Physical Exam ?Vitals reviewed.  ?Constitutional:   ?   General: She is not in acute distress. ?   Appearance: She is obese. She is not toxic-appearing.  ?HENT:  ?   Head: Normocephalic and atraumatic.  ?Eyes:  ?   Extraocular Movements: Extraocular movements intact.  ?   Pupils: Pupils are equal, round, and reactive to light.  ?Neck:  ?   Vascular: No JVD.  ?   Comments: Exam limited by obesity ?Cardiovascular:  ?   Rate and Rhythm: Normal rate and regular rhythm.  ?   Heart sounds: Heart sounds are distant.  ?Pulmonary:  ?   Effort: Pulmonary effort is normal. Tachypnea present.  ?   Breath sounds: Examination of the right-lower field reveals rales. Examination of the left-lower field reveals rales. Rales present.  ?   Comments: Minimal rales - patient has had IV diuretic ?Abdominal:  ?   General: Bowel sounds are normal.  ?   Palpations: Abdomen is soft.  ?   Comments: Massive obesity hinders exam. Non-tender, distant bowel sounds  ?Musculoskeletal:  ?   Cervical back: Normal range of motion and neck supple.  ?Skin: ?   General: Skin is warm and dry.  ?Neurological:  ?   General: No focal deficit present.  ?   Mental Status: She is alert and oriented to person, place, and time.  ?Psychiatric:     ?   Mood and Affect: Mood normal.     ?   Behavior: Behavior normal.  ?  ? ?Labs on Admission: I have personally reviewed following labs and imaging studies ? ?CBC: ?Recent Labs  ?Lab 07/25/21 ?1130  ?WBC 8.7  ?HGB 14.0  ?HCT 42.8  ?MCV 94.9  ?PLT 328  ? ?Basic Metabolic Panel: ?Recent Labs  ?Lab 07/25/21 ?1130  ?NA 135  ?K 4.2  ?CL 104  ?CO2 24  ?GLUCOSE 135*  ?BUN 16  ?CREATININE 0.99  ?CALCIUM 8.7*  ? ?GFR: ?CrCl cannot be calculated (Unknown ideal weight.). ?Liver Function Tests: ?No results for input(s): AST, ALT, ALKPHOS, BILITOT, PROT, ALBUMIN in the  last 168 hours. ?No results for input(s): LIPASE, AMYLASE in the last 168 hours. ?No results for input(s): AMMONIA in the last 168 hours. ?Coagulation Profile: ?No results for input(s): INR, PROTIME in the last 168 hours. ?Cardiac Enzymes: ?No results for input(s): CKTOTAL, CKMB, CKMBINDEX, TROPONINI in the last 168 hours. ?BNP (last 3 results) ?No results for input(s): PROBNP in the last 8760 hours. ?HbA1C: ?No results for input(s): HGBA1C in the last 72 hours. ?CBG: ?  No results for input(s): GLUCAP in the last 168 hours. ?Lipid Profile: ?No results for input(s): CHOL, HDL, LDLCALC, TRIG, CHOLHDL, LDLDIRECT in the last 72 hours. ?Thyroid Function Tests: ?No results for input(s): TSH, T4TOTAL, FREET4, T3FREE, THYROIDAB in the last 72 hours. ?Anemia Panel: ?No results for input(s): VITAMINB12, FOLATE, FERRITIN, TIBC, IRON, RETICCTPCT in the last 72 hours. ?Urine analysis: ?   ?Component Value Date/Time  ? COLORURINE AMBER (A) 07/25/2021 1515  ? APPEARANCEUR CLEAR (A) 07/25/2021 1515  ? LABSPEC 1.030 07/25/2021 1515  ? PHURINE 6.0 07/25/2021 1515  ? GLUCOSEU NEGATIVE 07/25/2021 1515  ? HGBUR SMALL (A) 07/25/2021 1515  ? [place] NEGATIVE 07/25/2021 1515  ? [place] NEGATIVE 07/25/2021 1515  ? PROTEINUR 100 (A) 07/25/2021 1515  ? NITRITE NEGATIVE 07/25/2021 1515  ? LEUKOCYTESUR NEGATIVE 07/25/2021 1515  ? ? ?Radiological Exams on Admission: I have personally reviewed images ?DG Chest 2 View ? ?Result Date: 07/25/2021 ?CLINICAL DATA:  sob EXAM: CHEST - 2 VIEW COMPARISON:  Chest radiograph dated January 13, 2013 FINDINGS: The cardiomediastinal silhouette is enlarged in contour. No pleural effusion. No pneumothorax. There are diffuse interstitial opacities with pulmonary vascular congestion and peribronchial cuffing. There are several more focal consolidative opacities noted early. Visualized abdomen is unremarkable. Multilevel degenerative changes of the thoracic spine. IMPRESSION: 1. Constellation of findings likely  reflect pulmonary edema with areas of confluent atelectasis given patient history. Given nodular appearance of several of these areas, recommend follow-up PA and lateral radiograph 3-4 weeks after approp

## 2021-07-25 NOTE — ED Notes (Signed)
Notified admitting provider of continued rapid afib; awaiting orders. Pt remains asymptomatic.  ?

## 2021-07-25 NOTE — Subjective & Objective (Signed)
Ms. Budzik, a 51 y/o, with h/o CHF diagnosed in 2011 in [place] where she was hospitalized and treated, h/o HTN, HLD, GERD, OA, mobid obesity (up to almost 500 lbs) s/p gastric sleeve followed by 130 lb weight loss, Asthma, OSA and childhood seizures. She also has metorrhagia for which she is to have a hysterectomy but is currently medically managed. She reports that she had increased SOB/DOE along with chest pressure and some abdominal pain. She presented to [facility]ED for evaluation. ?

## 2021-07-25 NOTE — ED Notes (Addendum)
Contacted admitting provider to clarify medication order. Pr Dr. Randol Kern, reschedule 1615pm dose of furosemide '40mg'$  IV to be administered at 2200pm. See MAR for change as requested.  ?

## 2021-07-25 NOTE — Assessment & Plan Note (Addendum)
--   Continue beta-blockers ?-- BP stable ?-- holding lisinopril for now ?

## 2021-07-25 NOTE — ED Provider Notes (Signed)
----------------------------------------- ?  5:24 PM on 07/25/2021 ?----------------------------------------- ?Patient noted to have sudden increase in heart rate from 160-170 on cardiac monitor.  On reassessment, patient reports that she does not feel any different than before, denies any chest pain, palpitations, or worsening shortness of breath compared to earlier.  She denies any history of atrial fibrillation or SVT, EKG appears consistent with atrial fibrillation with RVR, no ischemic changes noted.  We will give IV dose of metoprolol to control heart rate, hospitalist contacted by nursing staff. ? ?ED ECG REPORT ?Tempie Hoist, the attending physician, personally viewed and interpreted this ECG. ? ? Date: 07/25/2021 ? EKG Time: 17:19 ? Rate: 164 ? Rhythm: atrial fibrillation ? Axis: RAD ? Intervals:none ? ST&T Change: None ? ?  ?Blake Divine, MD ?07/25/21 1726 ? ?

## 2021-07-25 NOTE — Assessment & Plan Note (Addendum)
--  Patient with pulmonary edema by CXR and CTA. Minimal elevation in BNP.  ?-- Has h/o CHF in 2011 which was treated with ACE and furosemide.  ?--She has not had cardiology f/u and was told that after gastric sleeve she could d/c diuretic. ?-- Nuclear Stress in 2012 was negative.  ?--echo--showed EF 55% ?--coont po lasix, BB ?--hold lisinopril per cards rec--can consider starting it as outpatient  ?

## 2021-07-25 NOTE — Progress Notes (Signed)
PHARMACIST - PHYSICIAN COMMUNICATION ? ?CONCERNING:  Enoxaparin (Lovenox) for DVT Prophylaxis  ? ? ?RECOMMENDATION: ?Patient was prescribed enoxaprin '40mg'$  q24 hours for VTE prophylaxis.  ? ?Based on [facility] policy patient is candidate for enoxaparin 0.'5mg'$ /kg TBW SQ every 24 hours based on BMI being >30. ? ?DESCRIPTION: ?Pharmacy has adjusted enoxaparin dose per [facility] policy. ? ?Patient is now receiving enoxaparin 90 mg every 24 hours  ? ? ?Mills Koller, PharmD ?Clinical Pharmacist  ?07/25/2021 ?6:02 PM ?

## 2021-07-25 NOTE — Progress Notes (Signed)
Patient continues to transition between NSR and Afib RVR. Cardizem titrated as appropriate and as ordered. When patient arrived to unit, she was NSR 90s and Cardizem was at '5mg'$ . Currently, HR 150-160, patient asymptomatic, and Cardizem now infusing '10mg'$ . Will monitor.  ?

## 2021-07-25 NOTE — Assessment & Plan Note (Addendum)
Patient is for testing in July as outpatient. She has not been on CPAP. ?

## 2021-07-25 NOTE — ED Notes (Signed)
Pt's heart rate suddenly jumped from 90s to 170s. Dr. Charna Archer notified; obtained EKG. Will notify admitting MD and await additional orders. Pt is asymptomatic and denies prior hx of arrhythmia ?

## 2021-07-25 NOTE — ED Triage Notes (Signed)
Pt reports CP to her mid chest that is non radiating and really heavy in nature. Pt reports has also been really SOB over the last couple of weeks. Pt states has CHF and is concerned it is related to that but she hasn't had an issue with it in years.  ?

## 2021-07-26 ENCOUNTER — Encounter: Payer: Self-pay | Admitting: Internal Medicine

## 2021-07-26 DIAGNOSIS — I5033 Acute on chronic diastolic (congestive) heart failure: Secondary | ICD-10-CM | POA: Diagnosis not present

## 2021-07-26 DIAGNOSIS — I4891 Unspecified atrial fibrillation: Secondary | ICD-10-CM

## 2021-07-26 LAB — BASIC METABOLIC PANEL
Anion gap: 12 (ref 5–15)
BUN: 17 mg/dL (ref 6–20)
CO2: 26 mmol/L (ref 22–32)
Calcium: 8.5 mg/dL — ABNORMAL LOW (ref 8.9–10.3)
Chloride: 97 mmol/L — ABNORMAL LOW (ref 98–111)
Creatinine, Ser: 1.1 mg/dL — ABNORMAL HIGH (ref 0.44–1.00)
GFR, Estimated: >60 mL/min (ref >60)
Glucose, Bld: 153 mg/dL — ABNORMAL HIGH (ref 70–99)
Potassium: 3.5 mmol/L (ref 3.5–5.1)
Sodium: 135 mmol/L (ref 135–145)

## 2021-07-26 LAB — IRON: Iron: 65 ug/dL (ref 28–170)

## 2021-07-26 LAB — VITAMIN D 25 HYDROXY (VIT D DEFICIENCY, FRACTURES): Vit D, 25-Hydroxy: 28.35 ng/mL — ABNORMAL LOW (ref 30–100)

## 2021-07-26 LAB — VITAMIN B12: Vitamin B-12: 320 pg/mL (ref 180–914)

## 2021-07-26 LAB — MAGNESIUM: Magnesium: 2 mg/dL (ref 1.7–2.4)

## 2021-07-26 LAB — FOLATE: Folate: 9 ng/mL (ref >5.9)

## 2021-07-26 LAB — HIV ANTIBODY (ROUTINE TESTING W REFLEX): HIV Screen 4th Generation wRfx: NONREACTIVE

## 2021-07-26 MED ORDER — FUROSEMIDE 10 MG/ML IJ SOLN
40.0000 mg | Freq: Two times a day (BID) | INTRAMUSCULAR | Status: DC
  Administered 2021-07-26 (×2): 40 mg via INTRAVENOUS
  Filled 2021-07-26 (×3): qty 4

## 2021-07-26 MED ORDER — AMIODARONE HCL 200 MG PO TABS
200.0000 mg | ORAL_TABLET | Freq: Two times a day (BID) | ORAL | Status: DC
  Filled 2021-07-26: qty 1

## 2021-07-26 MED ORDER — CALCIUM CARBONATE ANTACID 500 MG PO CHEW
1.0000 | CHEWABLE_TABLET | Freq: Three times a day (TID) | ORAL | Status: DC
  Administered 2021-07-26 – 2021-07-28 (×7): 200 mg via ORAL
  Filled 2021-07-26 (×7): qty 1

## 2021-07-26 MED ORDER — DOCUSATE SODIUM 100 MG PO CAPS
100.0000 mg | ORAL_CAPSULE | Freq: Two times a day (BID) | ORAL | Status: DC
  Administered 2021-07-26 – 2021-07-28 (×3): 100 mg via ORAL
  Filled 2021-07-26 (×5): qty 1

## 2021-07-26 MED ORDER — MAGNESIUM GLUCONATE 500 MG PO TABS
500.0000 mg | ORAL_TABLET | Freq: Every day | ORAL | Status: DC
  Administered 2021-07-26 – 2021-07-28 (×3): 500 mg via ORAL
  Filled 2021-07-26 (×3): qty 1

## 2021-07-26 MED ORDER — AMIODARONE HCL 200 MG PO TABS: 200.0000 mg | ORAL_TABLET | Freq: Every day | ORAL | Status: DC

## 2021-07-26 MED ORDER — METOPROLOL TARTRATE 25 MG PO TABS
12.5000 mg | ORAL_TABLET | Freq: Three times a day (TID) | ORAL | Status: DC
  Administered 2021-07-26 (×2): 12.5 mg via ORAL
  Filled 2021-07-26 (×4): qty 1

## 2021-07-26 MED ORDER — ENOXAPARIN SODIUM 300 MG/3ML IJ SOLN
1.0000 mg/kg | Freq: Two times a day (BID) | INTRAMUSCULAR | Status: DC
  Administered 2021-07-26 – 2021-07-27 (×3): 180 mg via SUBCUTANEOUS
  Filled 2021-07-26 (×4): qty 1.8

## 2021-07-26 MED ORDER — POTASSIUM CHLORIDE CRYS ER 20 MEQ PO TBCR
40.0000 meq | EXTENDED_RELEASE_TABLET | Freq: Two times a day (BID) | ORAL | Status: AC
  Administered 2021-07-26 (×2): 40 meq via ORAL
  Filled 2021-07-26 (×2): qty 2

## 2021-07-26 MED ORDER — AMIODARONE HCL IN DEXTROSE 360-4.14 MG/200ML-% IV SOLN
60.0000 mg/h | INTRAVENOUS | Status: AC
  Administered 2021-07-26: 60 mg/h via INTRAVENOUS
  Filled 2021-07-26 (×2): qty 200

## 2021-07-26 MED ORDER — ENSURE MAX PROTEIN PO LIQD
11.0000 [oz_av] | Freq: Two times a day (BID) | ORAL | Status: DC
  Administered 2021-07-26 – 2021-07-28 (×4): 11 [oz_av] via ORAL
  Filled 2021-07-26: qty 330

## 2021-07-26 MED ORDER — METOPROLOL TARTRATE 5 MG/5ML IV SOLN
5.0000 mg | Freq: Once | INTRAVENOUS | Status: AC
  Administered 2021-07-26: 5 mg via INTRAVENOUS
  Filled 2021-07-26: qty 5

## 2021-07-26 MED ORDER — AMIODARONE HCL IN DEXTROSE 360-4.14 MG/200ML-% IV SOLN
30.0000 mg/h | INTRAVENOUS | Status: AC
  Administered 2021-07-26 – 2021-07-27 (×2): 30 mg/h via INTRAVENOUS
  Filled 2021-07-26 (×2): qty 200

## 2021-07-26 MED ORDER — METHOCARBAMOL 500 MG PO TABS
750.0000 mg | ORAL_TABLET | Freq: Once | ORAL | Status: AC
  Administered 2021-07-26: 750 mg via ORAL
  Filled 2021-07-26: qty 2

## 2021-07-26 MED ORDER — ADULT MULTIVITAMIN W/MINERALS CH
1.0000 | ORAL_TABLET | Freq: Two times a day (BID) | ORAL | Status: DC
  Administered 2021-07-26 – 2021-07-28 (×5): 1 via ORAL
  Filled 2021-07-26 (×5): qty 1

## 2021-07-26 MED ORDER — ORAL CARE MOUTH RINSE
15.0000 mL | Freq: Two times a day (BID) | OROMUCOSAL | Status: DC
  Administered 2021-07-26 – 2021-07-28 (×3): 15 mL via OROMUCOSAL

## 2021-07-26 MED ORDER — AMIODARONE LOAD VIA INFUSION
150.0000 mg | Freq: Once | INTRAVENOUS | Status: AC
  Administered 2021-07-26: 150 mg via INTRAVENOUS
  Filled 2021-07-26: qty 83.34

## 2021-07-26 NOTE — Progress Notes (Signed)
Initial Nutrition Assessment ? ?DOCUMENTATION CODES:  ? ?Morbid obesity ? ?INTERVENTION:  ? ?-Liberalize diet to 2 gram sodium for wider variety of meal selections ?-Ensure Max po BID, each supplement provides 150 kcal and 30 grams of protein. ?-MVI with minerals BID ?-500 mg calcium carbonate TID ?-Draw and monitor labs for potential deficiencies for gastric sleeve: iron, folic acid, thiamine, zinc, copper, vitamin B-12, vitamin D, vitamin E, vitamin A, and vitamin K ? ?NUTRITION DIAGNOSIS:  ? ?Increased nutrient needs related to chronic illness (CHF) as evidenced by estimated needs. ? ?GOAL:  ? ?Patient will meet greater than or equal to 90% of their needs ? ?MONITOR:  ? ?PO intake, Supplement acceptance ? ?REASON FOR ASSESSMENT:  ? ?Rounds ?  ? ?ASSESSMENT:  ? ?Pt with h/o CHF, HTN, HLD, GERD, OA, mobid obesity s/p gastric sleeve followed by 130 lb weight loss, Asthma, OSA and childhood seizures. She presents with increased SOB/DOE along with chest pressure and some abdominal pain. ? ?Pt admitted with CHF exacerbation.  ? ?Reviewed I/O's: -3.4 L x 24 hours ? ?UOP: 4 L x 24 hours ? ?Pt receiving nursing care at time of visit.  ? ?Noted pt underwent gastric sleeve procedure in January 2020. She initially lost 130#, but has regained most of her lost weight. Pt often uses food as a coping mechanism for stress. Per PCP notes, plan to follow-up with bariatric clinic in June 2023.  ? ?Pt currently on a heart healthy diet. RD will liberalize diet to 2 gram sodium for wider variety of meal selections.  ? ?Pt is at risk for micronutrient deficiencies due to gastric sleeve; will add standard bariatric multivitamins and draw labs to monitor for possible deficiencies.  ? ?Reviewed wt hx; no wt loss over the past year.  ? ?Medications reviewed and include colace and potassium chloride.  ? ?Labs reviewed.  ? ?NUTRITION - FOCUSED PHYSICAL EXAM: ? ?Flowsheet Row Most Recent Value  ?Orbital Region No depletion  ?Upper Arm Region  No depletion  ?Thoracic and Lumbar Region No depletion  ?Buccal Region No depletion  ?Temple Region No depletion  ?Clavicle Bone Region No depletion  ?Clavicle and Acromion Bone Region No depletion  ?Scapular Bone Region No depletion  ?Dorsal Hand No depletion  ?Patellar Region No depletion  ?Anterior Thigh Region No depletion  ?Posterior Calf Region No depletion  ?Edema (RD Assessment) Mild  ?Hair Reviewed  ?Eyes Reviewed  ?Mouth Reviewed  ?Skin Reviewed  ?Nails Reviewed  ? ?  ? ? ?Diet Order:   ?Diet Order   ? ?       ?  Diet Heart Room service appropriate? Yes; Fluid consistency: Thin  Diet effective now       ?  ? ?  ?  ? ?  ? ? ?EDUCATION NEEDS:  ? ?No education needs have been identified at this time ? ?Skin:  Skin Assessment: Reviewed RN Assessment ? ?Last BM:  07/23/21 ? ?Height:  ? ?Ht Readings from Last 1 Encounters:  ?07/25/21 '5\' 5"'$  (1.651 m)  ? ? ?Weight:  ? ?Wt Readings from Last 1 Encounters:  ?07/26/21 (!) 181.7 kg  ? ? ?Ideal Body Weight:  56.8 kg ? ?BMI:  Body mass index is 66.65 kg/m?. ? ?Estimated Nutritional Needs:  ? ?Kcal:  1700-1900 ? ?Protein:  110-125 grams ? ?Fluid:  > 1.7 L ? ? ? ?Loistine Chance, RD, LDN, CDCES ?Registered Dietitian II ?Certified Diabetes Care and Education Specialist ?Please refer to [facility] for RD and/or RD  on-call/weekend/after hours pager  ?

## 2021-07-26 NOTE — Consult Note (Signed)
ANTICOAGULATION CONSULT NOTE - Initial Consult ? ?Pharmacy Consult for Enoxaparin ?Indication:  Af with RVR and hx of [place] ? ?Allergies  ?Allergen Reactions  ? Sulfa Antibiotics Rash  ? ? ?Patient Measurements: ?Height: '5\' 5"'$  (165.1 cm) ?Weight: (!) 181.7 kg (400 lb 8 oz) ?IBW/kg (Calculated) : 57 ? ? ?Vital Signs: ?Temp: 98.7 ?F (37.1 ?C) (05/15 1205) ?Temp Source: Oral (05/15 1205) ?BP: 127/90 (05/15 1205) ?Pulse Rate: 122 (05/15 1205) ? ?Labs: ?Recent Labs  ?  07/25/21 ?1130 07/25/21 ?1344 07/26/21 ?0017  ?HGB 14.0  --   --   ?HCT 42.8  --   --   ?PLT 328  --   --   ?CREATININE 0.99  --  1.10*  ?TROPONINIHS 19* 19*  --   ? ? ?Estimated Creatinine Clearance: 104.4 mL/min (A) (by C-G formula based on SCr of 1.1 mg/dL (H)). ? ? ?Medical History: ?Past Medical History:  ?Diagnosis Date  ? Arthritis   ? Asthma   ? CHF (congestive heart failure) ([place])   ? Depression   ? GERD (gastroesophageal reflux disease)   ? Hypercholesteremia   ? Hypertension   ? Seizure ([place])   ? as a child  ? Sleep apnea   ? ? ?Assessment: ?Patient admitted with dyspnea, and chest discomfort,. Noted hx of HFpEF, OSA, asthma, HLD, HTN, pre-diabetes, hx of TIA, current BMI 66. ? ?Goal of Therapy:  ?Anti-Xa level 0.6-1 units/ml 4hrs after LMWH dose given ?Monitor platelets by anticoagulation protocol: Yes ?  ?Plan:  ?Lovenox '1mg'$ /kg every 12 hours. Plan transition to [place] prior to discharge. Will order anti-Xa level if patient to continue enoxaparin for > 48 hrs. ? ?Andrae Claunch Rodriguez-Guzman PharmD, BCPS ?07/26/2021 12:48 PM ? ? ?

## 2021-07-26 NOTE — Progress Notes (Signed)
Patient asymptomatic denying chest pain, heart fluttering, palpitations, shortness of breath, and chest tightness. Cardizem titrated up. IV lines assessed for infiltration or complication, all is well. BP and HR remain elevated. BP and HR will normalize and then increase when patient is at rest. No trigger for sudden HR and BP elevation. Will continue to monitor.   ? 07/25/21 2200  ?Assess: MEWS Score  ?BP (!) 134/110  ?Pulse Rate 74  ?ECG Heart Rate (!) 136  ?Resp (!) 24  ?SpO2 95 %  ?O2 Device Nasal Cannula  ?Patient Activity (if Appropriate) In bed  ?O2 Flow Rate (L/min) 2 L/min  ?Assess: MEWS Score  ?MEWS Temp 0  ?MEWS Systolic 0  ?MEWS Pulse 3  ?MEWS RR 1  ?MEWS LOC 0  ?MEWS Score 4  ?MEWS Score Color Red  ?Assess: if the MEWS score is Yellow or Red  ?Were vital signs taken at a resting state? Yes  ?Focused Assessment No change from prior assessment  ?Does the patient meet 2 or more of the SIRS criteria? No  ?MEWS guidelines implemented *See Row Information* Yes  ?Treat  ?MEWS Interventions Administered scheduled meds/treatments  ?Pain Scale 0-10  ?Pain Score 0  ?Take Vital Signs  ?Increase Vital Sign Frequency  Red: Q 1hr X 4 then Q 4hr X 4, if remains red, continue Q 4hrs ?(RED mews in ED as well)  ?Escalate  ?MEWS: Escalate Red: discuss with charge nurse/RN and provider, consider discussing with RRT  ?Notify: Charge Nurse/RN  ?Name of Charge Nurse/RN Notified Marcene Brawn  ?Date Charge Nurse/RN Notified 07/25/21  ?Time Charge Nurse/RN Notified 2200  ?Document  ?Patient Outcome Other (Comment) ?(stable)  ?Progress note created (see row info) Yes  ?Assess: SIRS CRITERIA  ?SIRS Temperature  0  ?SIRS Pulse 1  ?SIRS Respirations  1  ?SIRS WBC 0  ?SIRS Score Sum  2  ? ? ?

## 2021-07-26 NOTE — Progress Notes (Signed)
pT. REFUSED CPAP FOR TONIGHT. ?

## 2021-07-26 NOTE — Progress Notes (Addendum)
?PROGRESS NOTE ? ? ? ?Stacie Shah  VOZ:[id number] DOB: [birth year] DOA: 07/25/2021 ?PCP: [facility]  ? ?Brief Narrative:  ?This 51 years old female with history of CHF diagnosed in 2011 in [place] where she was hospitalized and treated, history of hypertension, hyperlipidemia, GERD, obstructive sleep apnea, morbid obesity up to almost 500 pounds, s/p gastric sleeve followed by 130 pound weight loss, asthma, OSA, childhood seizures, history of mature AKI for which she is about to have a hysterectomy but is currently medically managed.  She reports she had increased shortness of breath along with chest pressure and some abdominal pain,  she presented in the ED for evaluation.  She was tachypneic had bilateral basilar rales on auscultation.  BNP 283.  CTA chest negative for PE but consistent with pulmonary edema.  Patient is started on IV diuresis. ? ?Assessment & Plan: ?  ?Principal Problem: ?  CHF (congestive heart failure) ([place]) ?Active Problems: ?  Chronic atrial fibrillation with RVR ([place]) ?  HTN (hypertension) ?  HLD (hyperlipidemia) ?  GERD (gastroesophageal reflux disease) ?  Seizure ([place]) ?  OSA (obstructive sleep apnea) ? ?Acute on chronic diastolic CHF: ?Patient was diagnosed with CHF in 2011, previously followed by Dr. Nehemiah Massed but lost follow-up since 2019. ?Patient presented with 1 week history of increased shortness of breath, orthopnea and generalized edema . ?CTA chest shows pulmonary edema.  No evidence of PE. ?Continue IV diuresis 40 mg IV every 12hr, net negative balance 4.2 L in 24 hours. ?Continue home lisinopril , consider further escalation of GDMT with beta-blockers. ?Obtain 2D echocardiogram. ? ? ?Intake/Output Summary (Last 24 hours) at 07/26/2021 1213 ?Last data filed at 07/26/2021 1207 ?Gross per 24 hour  ?Intake 873.06 ml  ?Output 5125 ml  ?Net -4251.94 ml  ?  ? ?New onset atrial fibrillation with RVR: ?Patient was found to have atrial fibrillation with RVR overnight,   has been in and out of flutter this morning after diltiazem gtt. Given low blood pressure patient was started on amiodarone gtt. remains in tacchy irregular rhythm this morning. ?Continue IV amiodarone gtt. for 24 hours then switch to p.o. as per cardiology. ?Continue metoprolol 12.5 mg 3 times daily.  Patient has a CHA2DS2-VASc score of 4 so anticoagulation is recommended for stroke risk reduction. ?Continue Lovenox for now consider DOAC before discharge ? ?Morbid obesity: ?Estimated body mass index is 66.65 kg/m? as calculated from the following: ?  Height as of this encounter: '5\' 5"'$  (1.651 m). ?  Weight as of this encounter: 181.7 kg.  ?Status post gastric sleeve surgery 2022 diet and exercise discussed. ? ?Essential hypertension: ?Continue home blood pressure medications. ? ?Obstructive sleep apnea: ?Patient has been noncompliant with CPAP.   ?Patient needs sleep studies. ? ? ?Seizure disorder: ?Patient reported history of childhood seizures,  no seizures in many years.  Not on antiepileptic medications. ? ?GERD : ?Continue PPI. ? ?Hyperlipidemia:  Continue statins. ? ? ? ?DVT prophylaxis: Lovenox ?Code Status: Full code. ?Family Communication: Niece at bedside. ?Disposition Plan:  ?Admitted for CHF exacerbation and found to have new onset A-fib with RVR requiring IV amiodarone for hypotension, A. Fibrillation.  Cardiology is consulted. ? ? ?Consultants:  ?Cardiology ? ?Procedures:  ?Echo cardiogram ?Antimicrobials:   ?Anti-infectives (From admission, onward)  ? ? None  ? ?  ?  ? ?Subjective: ?Patient was seen and examined at bedside.  Overnight events noted.  Patient appears comfortable,  denies any chest pain,  palpitations,  shortness of  breath or dizziness. ? ?Objective: ?Vitals:  ? 07/26/21 0747 07/26/21 0821 07/26/21 1013 07/26/21 1205  ?BP: 139/81 (!) 123/98 112/68 127/90  ?Pulse: (!) 120 (!) 126 (!) 130 (!) 122  ?Resp: (!) '22 16 18 '$ (!) 22  ?Temp: 98 ?F (36.7 ?C)   98.7 ?F (37.1 ?C)  ?TempSrc: Oral   Oral   ?SpO2: 95% 96%  95%  ?Weight:      ?Height:      ? ? ?Intake/Output Summary (Last 24 hours) at 07/26/2021 1213 ?Last data filed at 07/26/2021 1207 ?Gross per 24 hour  ?Intake 873.06 ml  ?Output 5125 ml  ?Net -4251.94 ml  ? ?Filed Weights  ? 07/25/21 1816 07/25/21 2121 07/26/21 0259  ?Weight: (!) 182.3 kg (!) 182.9 kg (!) 181.7 kg  ? ? ?Examination: ? ?General exam: Appears comfortable, not in any acute distress.  Deconditioned ?Respiratory system: CTA bilaterally, no wheezing, no crackles, normal respiratory effort. ?Cardiovascular system: S1-S2 heard, irregular rhythm, no murmur. ?Gastrointestinal system: Abdomen is soft, nontender, nondistended, BS + ?Central nervous system: Alert and oriented. No focal neurological deficits. ?Extremities: No edema, no cyanosis, no clubbing. ?Skin: No rashes, lesions or ulcers ?Psychiatry: Judgement and insight appear normal. Mood & affect appropriate.  ? ? ? ?Data Reviewed: I have personally reviewed following labs and imaging studies ? ?CBC: ?Recent Labs  ?Lab 07/25/21 ?1130  ?WBC 8.7  ?HGB 14.0  ?HCT 42.8  ?MCV 94.9  ?PLT 328  ? ?Basic Metabolic Panel: ?Recent Labs  ?Lab 07/25/21 ?1130 07/26/21 ?1610  ?NA 135 135  ?K 4.2 3.5  ?CL 104 97*  ?CO2 24 26  ?GLUCOSE 135* 153*  ?BUN 16 17  ?CREATININE 0.99 1.10*  ?CALCIUM 8.7* 8.5*  ?MG  --  2.0  ? ?GFR: ?Estimated Creatinine Clearance: 104.4 mL/min (A) (by C-G formula based on SCr of 1.1 mg/dL (H)). ?Liver Function Tests: ?No results for input(s): AST, ALT, ALKPHOS, BILITOT, PROT, ALBUMIN in the last 168 hours. ?No results for input(s): LIPASE, AMYLASE in the last 168 hours. ?No results for input(s): AMMONIA in the last 168 hours. ?Coagulation Profile: ?No results for input(s): INR, PROTIME in the last 168 hours. ?Cardiac Enzymes: ?No results for input(s): CKTOTAL, CKMB, CKMBINDEX, TROPONINI in the last 168 hours. ?BNP (last 3 results) ?No results for input(s): PROBNP in the last 8760 hours. ?HbA1C: ?No results for input(s): HGBA1C in  the last 72 hours. ?CBG: ?No results for input(s): GLUCAP in the last 168 hours. ?Lipid Profile: ?No results for input(s): CHOL, HDL, LDLCALC, TRIG, CHOLHDL, LDLDIRECT in the last 72 hours. ?Thyroid Function Tests: ?No results for input(s): TSH, T4TOTAL, FREET4, T3FREE, THYROIDAB in the last 72 hours. ?Anemia Panel: ?No results for input(s): VITAMINB12, FOLATE, FERRITIN, TIBC, IRON, RETICCTPCT in the last 72 hours. ?Sepsis Labs: ?No results for input(s): PROCALCITON, LATICACIDVEN in the last 168 hours. ? ?Recent Results (from the past 240 hour(s))  ?Resp Panel by RT-PCR (Flu A&B, Covid) Nasopharyngeal Swab     Status: None  ? Collection Time: 07/25/21  1:44 PM  ? Specimen: Nasopharyngeal Swab; Nasopharyngeal(NP) swabs in vial transport medium  ?Result Value Ref Range Status  ? SARS Coronavirus 2 by RT PCR NEGATIVE NEGATIVE Final  ?  Comment: (NOTE) ?SARS-CoV-2 target nucleic acids are NOT DETECTED. ? ?The SARS-CoV-2 RNA is generally detectable in upper respiratory ?specimens during the acute phase of infection. The lowest ?concentration of SARS-CoV-2 viral copies this assay can detect is ?138 copies/mL. A negative result does not preclude SARS-Cov-2 ?infection and should  not be used as the sole basis for treatment or ?other patient management decisions. A negative result may occur with  ?improper specimen collection/handling, submission of specimen other ?than nasopharyngeal swab, presence of viral mutation(s) within the ?areas targeted by this assay, and inadequate number of viral ?copies(<138 copies/mL). A negative result must be combined with ?clinical observations, patient history, and epidemiological ?information. The expected result is Negative. ? ?Fact Sheet for Patients:  ?EntrepreneurPulse.com.au ? ?Fact Sheet for Healthcare Providers:  ?IncredibleEmployment.be ? ?This test is no t yet approved or cleared by the [place] FDA and  ?has been authorized for detection  and/or diagnosis of SARS-CoV-2 by ?FDA under an Emergency Use Authorization (EUA). This EUA will remain  ?in effect (meaning this test can be used) for the duration of the ?COVID-19 declaration under Sect

## 2021-07-26 NOTE — Progress Notes (Addendum)
? ?      CROSS COVER NOTE ? ?NAME: Stacie Shah ?MRN: [id number] ?DOB : 06/28/1970 ? ? ? ?Date of Service ?  07/26/2021  ?HPI/Events of Note ?  Secure chat received from nursing "Cardizem is maxed out at '15mg'$ /hr. He HR remains 130-150s. BP 129/105. She remains asymptomatic. Denies CP, SOB, difficulty breathing, chest tightness, etc. Please advise. " ? ?BP 129/105 HR 143 RR 25 SPO2 94% on 2L via [place] ? ?Stacie Shah is a 51 yo F who presented to [facility] ED with reports of SOB, DOE, chest pain, and abdominal pain. PMH CHF, HTN, HLD, GERD, OA, morbid obesity, asthma, OSA, childhood seizures, and menorrhagia.  ?Interventions ?  Plan: ?Atrial Fibrillation with Rapid Ventricular Response ?IV Amiodarone load and continuous infusion, D/c Cardizem gtt. ?Add Magnesium to AM labs ?Consider Cardiology consult ?ECHO already ordered by Dr Stacie Shah ?qHS CPAP ? ?   ?  ?Stacie Glass DNP, MHA, FNP-BC ?Nurse Practitioner ?[facility] ?[place] ?Pager [phone] ? ?

## 2021-07-26 NOTE — Consult Note (Signed)
[place] NOTE       Patient ID: Stacie Shah MRN: [id number] DOB/AGE: 09-15-1970 51 y.o.  Admit date: 07/25/2021 Referring Physician Dr. Shawna Clamp Primary Physician Dr. Sara Chu  Primary Cardiologist Dr. Serafina Royals (last seen 2019)  Reason for Consultation AF RVR, CHF  HPI: Stacie Shah is a 51yoF with a past medical history of morbid obesity s/p history of gastric sleeve 2020 (current BMI 78), HFpEF, obstructive sleep apnea, asthma, hypertension, hyperlipidemia, prediabetes, history of TIA, who presented to [facility] ED 07/25/2021 with worsening dyspnea on exertion and chest discomfort.  Cardiology is consulted for further assistance with her new onset A-fib and diastolic CHF.  The patient states that about a week ago she started experiencing worsening dyspnea on exertion similar to 2011 when she was admitted at [facility] for heart failure reportedly.  She thought she should come into the ER, but decided to delay her presentation due to a scheduled beach trip she had already paid for.  She presents to the ED yesterday after severe shortness of breath, walking only 15 steps before "feeling like she could pass out" and chest tightness that resolved with application of oxygen, and some abdominal discomfort.  She admits to occasional heart racing and dizziness, but denies syncope.  Admits to orthopnea.  She has an upcoming appointment with for a repeat sleep study.  She has been given 4 doses of IV Lasix 40 mg with excellent urine output, and feels much better since presentation overall.  Overnight, the patient went into rapid A-fib versus flutter with rates maintaining in the 130s, started on a diltiazem and amiodarone infusion with initial control of her heart rate and conversion to NSR, once diltiazem was titrated off her heart rate increased again.  Recent vitals are notable for a blood pressure of 123/98, heart rate 126.  SPO2 maintained on 2L by [place].  Labs on  admission are notable for potassium 3.5, magnesium 2.0 BUN/creatinine 17/1.10, EGFR greater than 60.  BNP elevated to 283.8, high-sensitivity troponin minimally elevated and flat trending at 19-19.  H&H 14/42.8, platelets 328  Chest x-ray with constellation of findings likely reflecting pulmonary edema with areas of confluent atelectasis given patient history.  Given nodular appearance of several areas follow-up PA and lateral chest x-ray in 3 to 4 weeks is recommended.  CTA chest negative for pulmonary embolism with bilateral airspace disease, basilar predominant septal thickening and bilateral pleural effusions most consistent with congestive heart failure.  Review of systems complete and found to be negative unless listed above     Past Medical History:  Diagnosis Date   Arthritis    Asthma    CHF (congestive heart failure) (HCC)    Depression    GERD (gastroesophageal reflux disease)    Hypercholesteremia    Hypertension    Seizure ([place])    as a child   Sleep apnea     Past Surgical History:  Procedure Laterality Date   ABDOMINAL SURGERY     Gastric Sleeve   CATARACT EXTRACTION W/PHACO Right 05/24/2019   Procedure: CATARACT EXTRACTION PHACO AND INTRAOCULAR LENS PLACEMENT ([place]) RIGHT DIABETIC;  Surgeon: Birder Robson, MD;  Location: [facility] ORS;  Service: Ophthalmology;  Laterality: Right;  [place] 00:23.6 CDE 1.88 Fluid Pack Lot # [id number]   CHOLECYSTECTOMY     FOOT SURGERY     cyst removal from heel   REPEAT CESAREAN SECTION     c-section x3   TYMPANOSTOMY      Medications Prior  to Admission  Medication Sig Dispense Refill Last Dose   acetaminophen (TYLENOL) 500 MG tablet Take 500 mg by mouth every 6 (six) hours as needed for mild pain, moderate pain, fever or headache.   PRN at PRN   albuterol (VENTOLIN HFA) 108 (90 Base) MCG/ACT inhaler Inhale 2 puffs into the lungs every 4 (four) hours as needed for wheezing or shortness of breath.   07/24/2021   atorvastatin (LIPITOR) 10 MG  tablet Take 10 mg by mouth daily.   07/24/2021   cyclobenzaprine (FLEXERIL) 5 MG tablet Take 5 mg by mouth in the morning, at noon, and at bedtime.   07/24/2021   DULoxetine (CYMBALTA) 60 MG capsule Take 60 mg by mouth 2 (two) times daily.    07/25/2021 at am   lisinopril (ZESTRIL) 10 MG tablet Take 10 mg by mouth daily.    07/24/2021   magnesium oxide (MAG-OX) 400 MG tablet Take 400 mg by mouth every other day.   07/24/2021   norethindrone (AYGESTIN) 5 MG tablet Take 10-15 mg by mouth daily.   07/24/2021   omeprazole (PRILOSEC) 20 MG capsule Take 20 mg by mouth daily.   07/24/2021 at PRN   traZODone (DESYREL) 150 MG tablet Take 150 mg by mouth at bedtime.    07/24/2021 at NIGHT   fluticasone (FLONASE) 50 MCG/ACT nasal spray Place 2 sprays into both nostrils daily as needed.  (Patient not taking: Reported on 07/25/2021)   Not Taking   furosemide (LASIX) 40 MG tablet Take 40 mg by mouth as needed. (Patient not taking: Reported on 07/25/2021)   Not Taking   topiramate (TOPAMAX) 100 MG tablet Take 100 mg by mouth daily. (Patient not taking: Reported on 07/25/2021)   Not Taking   Social History   Socioeconomic History   Marital status: Divorced    Spouse name: Not on file   Number of children: Not on file   Years of education: Not on file   Highest education level: Not on file  Occupational History   Not on file  Tobacco Use   Smoking status: Never   Smokeless tobacco: Never  Substance and Sexual Activity   Alcohol use: Yes    Comment: socially    Drug use: No   Sexual activity: Not on file  Other Topics Concern   Not on file  Social History Narrative   Not on file   Social Determinants of Health   Financial Resource Strain: Not on file  Food Insecurity: Not on file  Transportation Needs: Not on file  Physical Activity: Not on file  Stress: Not on file  Social Connections: Not on file  Intimate Partner Violence: Not on file    History reviewed. No pertinent family history.     PHYSICAL EXAM General: Obese Caucasian female, well nourished, in no acute distress.  Sitting at incline in PCU bed with her niece and daughter at bedside. HEENT:  Normocephalic and atraumatic. Neck:  No JVD.  Lungs: Normal respiratory effort on oxygen by nasal cannula. Clear bilaterally to auscultation anteriorly. No wheezes, crackles, rhonchi.  Heart: Tachy irregularly irregular. Normal S1 and S2 without gallops or murmurs. Radial & DP pulses 2+ bilaterally. Abdomen: morbidly obese appearing.  Msk: Normal strength and tone for age. Extremities: Warm and well perfused. No clubbing, cyanosis. Trace bilateral LE edema L>R.  Neuro: Alert and oriented X 3. Psych:  Answers questions appropriately.   Labs:   Lab Results  Component Value Date   WBC 8.7 07/25/2021  HGB 14.0 07/25/2021   HCT 42.8 07/25/2021   MCV 94.9 07/25/2021   PLT 328 07/25/2021    Recent Labs  Lab 07/26/21 0513  NA 135  K 3.5  CL 97*  CO2 26  BUN 17  CREATININE 1.10*  CALCIUM 8.5*  GLUCOSE 153*   Lab Results  Component Value Date   CKTOTAL 59 01/13/2013   CKMB < 0.5 (L) 01/13/2013   TROPONINI < 0.02 01/13/2013    Lab Results  Component Value Date   CHOL 149 01/13/2013   Lab Results  Component Value Date   HDL 35 (L) 01/13/2013   Lab Results  Component Value Date   LDLCALC 82 01/13/2013   Lab Results  Component Value Date   TRIG 162 01/13/2013   No results found for: CHOLHDL No results found for: LDLDIRECT    Radiology: DG Chest 2 View  Result Date: 07/25/2021 CLINICAL DATA:  sob EXAM: CHEST - 2 VIEW COMPARISON:  Chest radiograph dated January 13, 2013 FINDINGS: The cardiomediastinal silhouette is enlarged in contour. No pleural effusion. No pneumothorax. There are diffuse interstitial opacities with pulmonary vascular congestion and peribronchial cuffing. There are several more focal consolidative opacities noted early. Visualized abdomen is unremarkable. Multilevel degenerative  changes of the thoracic spine. IMPRESSION: 1. Constellation of findings likely reflect pulmonary edema with areas of confluent atelectasis given patient history. Given nodular appearance of several of these areas, recommend follow-up PA and lateral radiograph 3-4 weeks after appropriate treatment to assess for resolution and exclude underlying malignancy. Electronically Signed   By: Valentino Saxon M.D.   On: 07/25/2021 12:00   CT Angio Chest PE W and/or Wo Contrast  Result Date: 07/25/2021 CLINICAL DATA:  Nonradiating chest pain and chest heaviness, shortness of breath for several weeks EXAM: CT ANGIOGRAPHY CHEST WITH CONTRAST TECHNIQUE: Multidetector CT imaging of the chest was performed using the standard protocol during bolus administration of intravenous contrast. Multiplanar CT image reconstructions and MIPs were obtained to evaluate the vascular anatomy. RADIATION DOSE REDUCTION: This exam was performed according to the departmental dose-optimization program which includes automated exposure control, adjustment of the mA and/or kV according to patient size and/or use of iterative reconstruction technique. CONTRAST:  142mL OMNIPAQUE IOHEXOL 350 MG/ML SOLN COMPARISON:  07/25/2021 FINDINGS: Cardiovascular: This is a technically adequate evaluation of the pulmonary vasculature. No filling defects or pulmonary emboli. Heart is unremarkable without pericardial effusion. Normal caliber of the thoracic aorta. Mediastinum/Nodes: No enlarged mediastinal, hilar, or axillary lymph nodes. Thyroid gland, trachea, and esophagus demonstrate no significant findings. Lungs/Pleura: Small bilateral pleural effusions. There is bilateral perihilar airspace disease, most pronounced within the right middle and bilateral lower lobes. Basilar predominant interlobular septal thickening. Overall, findings favor congestive heart failure over multifocal pneumonia. No pneumothorax. Central airways are patent. Upper Abdomen:  Postsurgical changes from cholecystectomy and bariatric surgery. No acute upper abdominal findings. Musculoskeletal: No acute or destructive bony lesions. Reconstructed images demonstrate no additional findings. Review of the MIP images confirms the above findings. IMPRESSION: 1. No evidence of pulmonary embolus. 2. Bilateral airspace disease, basilar predominant septal thickening, and bilateral pleural effusions most consistent with congestive heart failure. Electronically Signed   By: Randa Ngo M.D.   On: 07/25/2021 15:04    ECHO no prior available   TELEMETRY reviewed by me: A-fib versus flutter with rates in the 130s, converting to sinus rhythm for a short period of time before flipping back into A-fib versus flutter with rates in the 130s.  EKG reviewed by  me: initial EKG sinus tach with PVCs rate 104, repeat showing Afib vs flutter rate 133  ASSESSMENT AND PLAN:  Katelynd Blauvelt is a 18yoF with a past medical history of morbid obesity s/p history of gastric sleeve 2020 (current BMI 66), HFpEF, obstructive sleep apnea, asthma, hypertension, hyperlipidemia, prediabetes, history of TIA, who presented to [facility] ED 07/25/2021 with worsening dyspnea on exertion and chest discomfort.  Cardiology is consulted for further assistance with her new onset A-fib and diastolic CHF.  #Acute on chronic HFpEF With reported diagnosis in 2011, followed by Dr. Nehemiah Massed previously, but lost to follow-up since 2019.  She reports a 1 week history of increased dyspnea on exertion, orthopnea, and generalized edema, and imaging showed pulmonary edema and pleural effusions.  BNP elevated to 238 she appears grossly volume overloaded on exam. -S/p 40 mg of IV Lasix x4 doses with at least 3.3L reported output so far.  Continue 40 mg IV Lasix twice daily for now with close monitoring of her renal function. -Continue home lisinopril 10 mg for GDMT -Consider further escalation of GDMT with beta-blocker as below, consider addition  of jardiance -strict I's/O -Echocardiogram complete  #New onset atrial fibrillation/flutter with RVR Patient was initially in sinus rhythm when converting to A-fib/flutter with RVR overnight and has been in and out of flutter this morning after diltiazem gtt. Started on amiodarone gtt overnight. Remains in tachy irregular rhythm this morning. Likely etiology complicated by morbid obesity and OSA. -continue IV amiodarone gtt  for 24 hours, then switch to PO dosing -ordered IV metoprolol x1 dose, then metoprolol tartrate 12.64m TID -CHADSVASC 4 (sex, HTN, h/o TIA), so AC is recommended for stroke risk reduction. Will increase lovenox to weight based dosing  and likely discharge on a DOAC   #hyperlipidemia #hypertension  -continue atorvastatin -other medication changes as above   #morbid obesity #OSA S/p gastric sleeve surgery 2020 -on semaglutide SQ weekly -not currently on CPAP but is pending repeat sleep study for further eval, encouraged follow up with this   This patient's plan of care was discussed and created with Dr. CClayborn Bignessand he is in agreement.  Signed: LTristan Schroeder, PA-C 07/26/2021, 9:57 AM [facility]

## 2021-07-26 NOTE — Progress Notes (Signed)
Stable and no distress or discomfort noted. Provider and RRT aware. Remains hypertensive and Afib RVR. Awaiting orders. Will continue to monitor  ? 07/26/21 0100  ?Assess: MEWS Score  ?BP (!) 129/105  ?Pulse Rate (!) 52  ?ECG Heart Rate (!) 143  ?Resp (!) 25  ?SpO2 94 %  ?O2 Device Nasal Cannula  ?Patient Activity (if Appropriate) In bed  ?O2 Flow Rate (L/min) 2 L/min  ?Assess: MEWS Score  ?MEWS Temp 0  ?MEWS Systolic 0  ?MEWS Pulse 3  ?MEWS RR 1  ?MEWS LOC 0  ?MEWS Score 4  ?MEWS Score Color Red  ?Assess: if the MEWS score is Yellow or Red  ?Were vital signs taken at a resting state? Yes  ?Focused Assessment No change from prior assessment  ?Does the patient meet 2 or more of the SIRS criteria? No  ?MEWS guidelines implemented *See Row Information* Yes  ?Treat  ?MEWS Interventions Escalated (See documentation below) ?(RRT and provider aware)  ?Pain Scale 0-10  ?Pain Score 0  ?Take Vital Signs  ?Increase Vital Sign Frequency   ?(continue with Q1hr VS)  ?Escalate  ?MEWS: Escalate  ?(continue with Q1hr VS)  ?Notify: Provider  ?Provider Name/Title Neomia Glass NP  ?Date Provider Notified 07/26/21  ?Time Provider Notified [phone]  ?Method of Notification Page  ?Notification Reason Critical result ?(AFib RVR and HTN)  ?Date of Provider Response 07/26/21  ?Time of Provider Response [phone]  ?Notify: Rapid Response  ?Name of Rapid Response RN Notified [place] RN  ?Date Rapid Response Notified 07/26/21  ?Time Rapid Response Notified 0100  ?Document  ?Patient Outcome Other (Comment) ?(stable;asymptomatic)  ?Progress note created (see row info) Yes  ?Assess: SIRS CRITERIA  ?SIRS Temperature  0  ?SIRS Pulse 1  ?SIRS Respirations  1  ?SIRS WBC 0  ?SIRS Score Sum  2  ? ? ?

## 2021-07-26 NOTE — Progress Notes (Signed)
After Amiodarone was initiated, at 0427 patient converted to NSR and has HR in 70-80s. Will continue to monitor.  ?Patient converted to NSR 2 other times but did not sustain and transitioned back into Afib RVR. ? ?

## 2021-07-26 NOTE — Progress Notes (Signed)
Pt's heart rate jumped up to 130's-140's w/ no movement/cause. Asymptomatic. Dr. Dwyane Dee notified. Stable BP. ?

## 2021-07-27 ENCOUNTER — Inpatient Hospital Stay
Admission: EM | Admit: 2021-07-27 | Discharge: 2021-07-27 | Disposition: A | Discharge status: Home / Self Care | Payer: Medicaid Other | Source: Home / Self Care | Attending: Cardiology | Admitting: Cardiology

## 2021-07-27 DIAGNOSIS — I5033 Acute on chronic diastolic (congestive) heart failure: Secondary | ICD-10-CM | POA: Diagnosis not present

## 2021-07-27 LAB — BASIC METABOLIC PANEL
Anion gap: 11 (ref 5–15)
BUN: 24 mg/dL — ABNORMAL HIGH (ref 6–20)
CO2: 28 mmol/L (ref 22–32)
Calcium: 8.9 mg/dL (ref 8.9–10.3)
Chloride: 97 mmol/L — ABNORMAL LOW (ref 98–111)
Creatinine, Ser: 1.22 mg/dL — ABNORMAL HIGH (ref 0.44–1.00)
GFR, Estimated: 54 mL/min — ABNORMAL LOW (ref >60)
Glucose, Bld: 140 mg/dL — ABNORMAL HIGH (ref 70–99)
Potassium: 4.1 mmol/L (ref 3.5–5.1)
Sodium: 136 mmol/L (ref 135–145)

## 2021-07-27 LAB — HEPARIN ANTI-XA: Heparin LMW: 1.32 IU/mL

## 2021-07-27 MED ORDER — METOPROLOL TARTRATE 25 MG PO TABS
25.0000 mg | ORAL_TABLET | Freq: Three times a day (TID) | ORAL | Status: DC
Start: 2021-07-27 — End: 2021-07-28
  Administered 2021-07-27 – 2021-07-28 (×4): 25 mg via ORAL
  Filled 2021-07-27 (×3): qty 1

## 2021-07-27 MED ORDER — AMIODARONE LOAD VIA INFUSION: 150.0000 mg | Freq: Once | INTRAVENOUS | Status: DC

## 2021-07-27 MED ORDER — FUROSEMIDE 10 MG/ML IJ SOLN
40.0000 mg | Freq: Every day | INTRAMUSCULAR | Status: DC
  Administered 2021-07-27 – 2021-07-28 (×2): 40 mg via INTRAVENOUS
  Filled 2021-07-27 (×2): qty 4

## 2021-07-27 MED ORDER — ENOXAPARIN SODIUM 150 MG/ML IJ SOSY
140.0000 mg | PREFILLED_SYRINGE | Freq: Two times a day (BID) | INTRAMUSCULAR | Status: DC
  Administered 2021-07-28: 140 mg via SUBCUTANEOUS
  Filled 2021-07-27 (×2): qty 0.94

## 2021-07-27 MED ORDER — AMIODARONE HCL IN DEXTROSE 360-4.14 MG/200ML-% IV SOLN
30.0000 mg/h | INTRAVENOUS | Status: DC
  Administered 2021-07-27 (×3): 30 mg/h via INTRAVENOUS
  Filled 2021-07-27 (×2): qty 200

## 2021-07-27 NOTE — Consult Note (Signed)
ANTICOAGULATION CONSULT NOTE - Initial Consult ? ?Pharmacy Consult for Enoxaparin ?Indication:  Af with RVR and hx of [place] ? ?Allergies  ?Allergen Reactions  ? Sulfa Antibiotics Rash  ? ? ?Patient Measurements: ?Height: '5\' 5"'$  (165.1 cm) ?Weight: (!) 178.1 kg (392 lb 10.2 oz) ?IBW/kg (Calculated) : 57 ? ? ?Vital Signs: ?Temp: 98.4 ?F (36.9 ?C) (05/16 1630) ?Temp Source: Oral (05/16 1630) ?BP: 118/77 (05/16 1630) ?Pulse Rate: 82 (05/16 1630) ? ?Labs: ?Recent Labs  ?  07/25/21 ?1130 07/25/21 ?1344 07/26/21 ?1443 07/27/21 ?0324 07/27/21 ?1756  ?HGB 14.0  --   --   --   --   ?HCT 42.8  --   --   --   --   ?PLT 328  --   --   --   --   ?HEPRLOWMOCWT  --   --   --   --  1.32  ?CREATININE 0.99  --  1.10* 1.22*  --   ?TROPONINIHS 19* 19*  --   --   --   ? ? ? ?Estimated Creatinine Clearance: 92.8 mL/min (A) (by C-G formula based on SCr of 1.22 mg/dL (H)). ? ? ?Medical History: ?Past Medical History:  ?Diagnosis Date  ? Arthritis   ? Asthma   ? CHF (congestive heart failure) ([place])   ? Depression   ? GERD (gastroesophageal reflux disease)   ? Hypercholesteremia   ? Hypertension   ? Seizure ([place])   ? as a child  ? Sleep apnea   ? ? ?Assessment: ?Patient admitted with dyspnea, and chest discomfort,. Noted hx of HFpEF, OSA, asthma, HLD, HTN, pre-diabetes, hx of TIA, current BMI 66. ? ?Goal of Therapy:  ?Anti-Xa level 0.6-1 units/ml 4hrs after LMWH dose given ?Monitor platelets by anticoagulation protocol: Yes ?  ?Plan:  ?48hr Anti-xa level returned supratherapeutic at 1.32. Will reduce dose by 20% at next dosing interval. ?Lovenox '180mg'$  BID > '140mg'$  BID. Plan transition to [place] prior to discharge.  ? ?Lorna Dibble, PharmD, BCCP ?Clinical Pharmacist ?07/27/2021 7:13 PM ? ? ? ?

## 2021-07-27 NOTE — Progress Notes (Signed)
?PROGRESS NOTE ? ? ? ?Stacie Shah  TJQ:[id number] DOB: Aug 03, 1970 DOA: 07/25/2021 ?PCP: [facility]  ? ?Brief Narrative:  ?This 51 years old female with history of CHF diagnosed in 2011 in [place] where she was hospitalized and treated, history of hypertension, hyperlipidemia, GERD, obstructive sleep apnea, morbid obesity up to almost 500 pounds, s/p gastric sleeve followed by 130 pound weight loss, asthma, OSA, childhood seizures, history of metorrhagia for which she is about to have a hysterectomy but is currently medically managed.  She reports she had increased shortness of breath along with chest pressure and some abdominal pain,  she presented in the ED for evaluation.  She was tachypneic had bilateral basilar rales on auscultation.  BNP 283.  CTA chest negative for PE but consistent with pulmonary edema.  Patient is started on IV diuresis. ?Patient admitted for acute on chronic diastolic CHF and A-fib with RVR requiring amiodarone gtt.  Cardiology is consulted. ? ?Assessment & Plan: ?  ?Principal Problem: ?  CHF (congestive heart failure) ([place]) ?Active Problems: ?  Chronic atrial fibrillation with RVR ([place]) ?  HTN (hypertension) ?  HLD (hyperlipidemia) ?  GERD (gastroesophageal reflux disease) ?  Seizure ([place]) ?  OSA (obstructive sleep apnea) ? ?Acute on chronic diastolic CHF: ?Patient was diagnosed with CHF in 2011, previously followed by Dr. Nehemiah Massed but lost follow-up since 2019. ?Patient presented with 1 week history of increased shortness of breath, orthopnea and generalized edema . ?CTA chest shows pulmonary edema.  No evidence of PE.  ?BNP 283, but appears volume overloaded. ?Continue IV diuresis 40 mg IV every 12hr, net negative balance 1.9 L in 24 hours. ?Continue home lisinopril , consider further escalation of GDMT with beta-blockers. ?Obtain 2D echocardiogram. ? ? ?Intake/Output Summary (Last 24 hours) at 07/27/2021 1219 ?Last data filed at 07/27/2021 1146 ?Gross per 24 hour   ?Intake 960 ml  ?Output 2875 ml  ?Net -1915 ml  ?  ? ?New onset atrial fibrillation with RVR: ?Patient was found to have atrial fibrillation with RVR ,  has been in and out of A.fib this morning after diltiazem gtt.  ?Given low blood pressure patient was started on amiodarone gtt. remains in /out of A. fibrillation ?Continue IV amiodarone gtt. for 24 hours then switch to p.o. as per cardiology. ?Initiated  metoprolol 12.5 mg 3 times daily > Increased to metoprolol 25 mg TID for HR control. ?Patient has a CHA2DS2-VASc score of 4 so anticoagulation is recommended for stroke risk reduction. ?Continue Lovenox for now,  consider DOAC before discharge ? ?Morbid obesity: ?Estimated body mass index is 65.34 kg/m? as calculated from the following: ?  Height as of this encounter: '5\' 5"'$  (1.651 m). ?  Weight as of this encounter: 178.1 kg.  ?Status post gastric sleeve surgery 2022. ?Diet and exercise discussed. ? ?Essential hypertension: ?Continue metoprolol and Lasix. ? ?Obstructive sleep apnea: ?Patient has been noncompliant with CPAP.   ?Patient needs sleep studies. ? ?Seizure disorder: ?Patient reported history of childhood seizures,  no seizures in many years.   ?Not on antiepileptic medications. ? ?GERD : ?Continue PPI. ? ?Hyperlipidemia:   ?Continue statins. ? ?DVT prophylaxis: Lovenox ?Code Status: Full code. ?Family Communication: Niece at bedside. ?Disposition Plan:  ?Admitted for CHF exacerbation and found to have new onset A-fib with RVR requiring IV amiodarone for hypotension, A. Fibrillation.  Cardiology is consulted. ? ?Consultants:  ?Cardiology ? ?Procedures:  ?Echo cardiogram ?Antimicrobials:   ?Anti-infectives (From admission, onward)  ? ? None  ? ?  ?  ? ?  Subjective: ?Patient was seen and examined at bedside.  Overnight events noted.   ?Patient appears comfortable, denies any chest pain, shortness of breath, palpitations or dizziness. ?Her heart rate remains in the 130s - 140s,  denies any palpitations.    ? ?Objective: ?Vitals:  ? 07/27/21 0600 07/27/21 0737 07/27/21 0918 07/27/21 1141  ?BP:  119/85 111/82 108/75  ?Pulse:  (!) 120 96 81  ?Resp:  20 (!) 22 (!) 21  ?Temp:  98.1 ?F (36.7 ?C) 98 ?F (36.7 ?C) 98.3 ?F (36.8 ?C)  ?TempSrc:  Oral Oral Oral  ?SpO2:  97% 95% 96%  ?Weight: (!) 178.1 kg     ?Height:      ? ? ?Intake/Output Summary (Last 24 hours) at 07/27/2021 1219 ?Last data filed at 07/27/2021 1146 ?Gross per 24 hour  ?Intake 960 ml  ?Output 2875 ml  ?Net -1915 ml  ? ?Filed Weights  ? 07/25/21 2121 07/26/21 0259 07/27/21 0600  ?Weight: (!) 182.9 kg (!) 181.7 kg (!) 178.1 kg  ? ? ?Examination: ? ?General exam: Appears comfortable, not in any acute distress.  Deconditioned ?Respiratory system: CTA bilaterally, no wheezing, no crackles, normal respiratory effort. ?Cardiovascular system: S1-S2 heard, irregular rhythm, no murmur. ?Gastrointestinal system: Abdomen is soft, non tender, non distended, BS + ?Central nervous system: Alert and oriented x 3. No focal neurological deficits. ?Extremities: No edema, no cyanosis, no clubbing. ?Skin: No rashes, lesions or ulcers ?Psychiatry: Judgement and insight appear normal. Mood & affect appropriate.  ? ? ? ?Data Reviewed: I have personally reviewed following labs and imaging studies ? ?CBC: ?Recent Labs  ?Lab 07/25/21 ?1130  ?WBC 8.7  ?HGB 14.0  ?HCT 42.8  ?MCV 94.9  ?PLT 328  ? ?Basic Metabolic Panel: ?Recent Labs  ?Lab 07/25/21 ?1130 07/26/21 ?2585 07/27/21 ?0324  ?NA 135 135 136  ?K 4.2 3.5 4.1  ?CL 104 97* 97*  ?CO2 '24 26 28  '$ ?GLUCOSE 135* 153* 140*  ?BUN 16 17 24*  ?CREATININE 0.99 1.10* 1.22*  ?CALCIUM 8.7* 8.5* 8.9  ?MG  --  2.0  --   ? ?GFR: ?Estimated Creatinine Clearance: 92.8 mL/min (A) (by C-G formula based on SCr of 1.22 mg/dL (H)). ?Liver Function Tests: ?No results for input(s): AST, ALT, ALKPHOS, BILITOT, PROT, ALBUMIN in the last 168 hours. ?No results for input(s): LIPASE, AMYLASE in the last 168 hours. ?No results for input(s): AMMONIA in the last 168  hours. ?Coagulation Profile: ?No results for input(s): INR, PROTIME in the last 168 hours. ?Cardiac Enzymes: ?No results for input(s): CKTOTAL, CKMB, CKMBINDEX, TROPONINI in the last 168 hours. ?BNP (last 3 results) ?No results for input(s): PROBNP in the last 8760 hours. ?HbA1C: ?No results for input(s): HGBA1C in the last 72 hours. ?CBG: ?No results for input(s): GLUCAP in the last 168 hours. ?Lipid Profile: ?No results for input(s): CHOL, HDL, LDLCALC, TRIG, CHOLHDL, LDLDIRECT in the last 72 hours. ?Thyroid Function Tests: ?No results for input(s): TSH, T4TOTAL, FREET4, T3FREE, THYROIDAB in the last 72 hours. ?Anemia Panel: ?Recent Labs  ?  07/26/21 ?1226  ?VITAMINB12 320  ?FOLATE 9.0  ?IRON 65  ? ?Sepsis Labs: ?No results for input(s): PROCALCITON, LATICACIDVEN in the last 168 hours. ? ?Recent Results (from the past 240 hour(s))  ?Resp Panel by RT-PCR (Flu A&B, Covid) Nasopharyngeal Swab     Status: None  ? Collection Time: 07/25/21  1:44 PM  ? Specimen: Nasopharyngeal Swab; Nasopharyngeal(NP) swabs in vial transport medium  ?Result Value Ref Range Status  ? SARS Coronavirus  2 by RT PCR NEGATIVE NEGATIVE Final  ?  Comment: (NOTE) ?SARS-CoV-2 target nucleic acids are NOT DETECTED. ? ?The SARS-CoV-2 RNA is generally detectable in upper respiratory ?specimens during the acute phase of infection. The lowest ?concentration of SARS-CoV-2 viral copies this assay can detect is ?138 copies/mL. A negative result does not preclude SARS-Cov-2 ?infection and should not be used as the sole basis for treatment or ?other patient management decisions. A negative result may occur with  ?improper specimen collection/handling, submission of specimen other ?than nasopharyngeal swab, presence of viral mutation(s) within the ?areas targeted by this assay, and inadequate number of viral ?copies(<138 copies/mL). A negative result must be combined with ?clinical observations, patient history, and epidemiological ?information. The  expected result is Negative. ? ?Fact Sheet for Patients:  ?EntrepreneurPulse.com.au ? ?Fact Sheet for Healthcare Providers:  ?IncredibleEmployment.be ? ?This test is no t yet approv

## 2021-07-27 NOTE — Progress Notes (Signed)
?[facility] CONSULT NOTE  ? ?    ?Patient ID: ?Bunnell ?MRN: [id number] ?DOB/AGE: [birth year] 51 y.o. ? ?Admit date: 07/25/2021 ?Referring Physician Dr. Shawna Clamp ?Primary Physician Dr. Sara Chu  ?Primary Cardiologist Dr. Serafina Royals (last seen 2019)  ?Reason for Consultation AF RVR, CHF ? ?HPI: Stacie Shah is a 51yoF with a past medical history of morbid obesity s/p history of gastric sleeve 2020 (current BMI 66), HFpEF, obstructive sleep apnea, asthma, hypertension, hyperlipidemia, prediabetes, history of TIA, who presented to [facility] ED 07/25/2021 with worsening dyspnea on exertion and chest discomfort.  Cardiology is consulted for further assistance with her new onset A-fib and diastolic CHF. ? ?Interval History: ?-back and forth between sinus rhythm and AF/flutter with RVR overnight ?-continuing amio gtt at low dose and increasing PO metoprolol today ?-feels her breathing has improved somewhat, remains on supplemental O2 ? ?Review of systems complete and found to be negative unless listed above  ? ? ? ?Past Medical History:  ?Diagnosis Date  ? Arthritis   ? Asthma   ? CHF (congestive heart failure) ([place])   ? Depression   ? GERD (gastroesophageal reflux disease)   ? Hypercholesteremia   ? Hypertension   ? Seizure ([place])   ? as a child  ? Sleep apnea   ?  ?Past Surgical History:  ?Procedure Laterality Date  ? ABDOMINAL SURGERY    ? Gastric Sleeve  ? CATARACT EXTRACTION W/PHACO Right 05/24/2019  ? Procedure: CATARACT EXTRACTION PHACO AND INTRAOCULAR LENS PLACEMENT ([place]) RIGHT DIABETIC;  Surgeon: Birder Robson, MD;  Location: [facility] ORS;  Service: Ophthalmology;  Laterality: Right;  [place] 00:23.6 ?CDE 1.88 ?Fluid Pack Lot # [id number]  ? CHOLECYSTECTOMY    ? FOOT SURGERY    ? cyst removal from heel  ? REPEAT CESAREAN SECTION    ? c-section x3  ? TYMPANOSTOMY    ?  ?Medications Prior to Admission  ?Medication Sig Dispense Refill Last Dose  ? acetaminophen (TYLENOL) 500 MG tablet Take 500 mg  by mouth every 6 (six) hours as needed for mild pain, moderate pain, fever or headache.   PRN at PRN  ? albuterol (VENTOLIN HFA) 108 (90 Base) MCG/ACT inhaler Inhale 2 puffs into the lungs every 4 (four) hours as needed for wheezing or shortness of breath.   07/24/2021  ? atorvastatin (LIPITOR) 10 MG tablet Take 10 mg by mouth daily.   07/24/2021  ? cyclobenzaprine (FLEXERIL) 5 MG tablet Take 5 mg by mouth in the morning, at noon, and at bedtime.   07/24/2021  ? DULoxetine (CYMBALTA) 60 MG capsule Take 60 mg by mouth 2 (two) times daily.    07/25/2021 at am  ? lisinopril (ZESTRIL) 10 MG tablet Take 10 mg by mouth daily.    07/24/2021  ? magnesium oxide (MAG-OX) 400 MG tablet Take 400 mg by mouth every other day.   07/24/2021  ? norethindrone (AYGESTIN) 5 MG tablet Take 10-15 mg by mouth daily.   07/24/2021  ? omeprazole (PRILOSEC) 20 MG capsule Take 20 mg by mouth daily.   07/24/2021 at PRN  ? traZODone (DESYREL) 150 MG tablet Take 150 mg by mouth at bedtime.    07/24/2021 at NIGHT  ? fluticasone (FLONASE) 50 MCG/ACT nasal spray Place 2 sprays into both nostrils daily as needed.  (Patient not taking: Reported on 07/25/2021)   Not Taking  ? furosemide (LASIX) 40 MG tablet Take 40 mg by mouth as needed. (Patient not taking: Reported on 07/25/2021)  Not Taking  ? topiramate (TOPAMAX) 100 MG tablet Take 100 mg by mouth daily. (Patient not taking: Reported on 07/25/2021)   Not Taking  ? ?Social History  ? ?Socioeconomic History  ? Marital status: Divorced  ?  Spouse name: Not on file  ? Number of children: Not on file  ? Years of education: Not on file  ? Highest education level: Not on file  ?Occupational History  ? Not on file  ?Tobacco Use  ? Smoking status: Never  ? Smokeless tobacco: Never  ?Substance and Sexual Activity  ? Alcohol use: Yes  ?  Comment: socially   ? Drug use: No  ? Sexual activity: Not on file  ?Other Topics Concern  ? Not on file  ?Social History Narrative  ? Not on file  ? ?Social Determinants of Health   ? ?Financial Resource Strain: Not on file  ?Food Insecurity: Not on file  ?Transportation Needs: Not on file  ?Physical Activity: Not on file  ?Stress: Not on file  ?Social Connections: Not on file  ?Intimate Partner Violence: Not on file  ?  ?History reviewed. No pertinent family history.  ? ? ?PHYSICAL EXAM ?General: Obese Caucasian female, well nourished, in no acute distress.  Sitting at incline in PCU bed without family at bedside ?HEENT:  Normocephalic and atraumatic. ?Neck:  No JVD.  ?Lungs: Normal respiratory effort on oxygen by nasal cannula. Clear bilaterally to auscultation anteriorly. No wheezes, crackles, rhonchi.  ?Heart: regular rate and rhythm Normal S1 and S2 without gallops or murmurs. Radial & DP pulses 2+ bilaterally. ?Abdomen: morbidly obese appearing.  ?Msk: Normal strength and tone for age. ?Extremities: Warm and well perfused. No clubbing, cyanosis. Trace bilateral LE edema ?Neuro: Alert and oriented X 3. ?Psych:  Answers questions appropriately.  ? ?Labs: ?  ?Lab Results  ?Component Value Date  ? WBC 8.7 07/25/2021  ? HGB 14.0 07/25/2021  ? HCT 42.8 07/25/2021  ? MCV 94.9 07/25/2021  ? PLT 328 07/25/2021  ?  ?Recent Labs  ?Lab 07/27/21 ?0324  ?NA 136  ?K 4.1  ?CL 97*  ?CO2 28  ?BUN 24*  ?CREATININE 1.22*  ?CALCIUM 8.9  ?GLUCOSE 140*  ? ? ?Lab Results  ?Component Value Date  ? CKTOTAL 59 01/13/2013  ? CKMB < 0.5 (L) 01/13/2013  ? TROPONINI < 0.02 01/13/2013  ? ?  ?Lab Results  ?Component Value Date  ? CHOL 149 01/13/2013  ? ?Lab Results  ?Component Value Date  ? HDL 35 (L) 01/13/2013  ? ?Lab Results  ?Component Value Date  ? [place] 82 01/13/2013  ? ?Lab Results  ?Component Value Date  ? TRIG 162 01/13/2013  ? ?No results found for: CHOLHDL ?No results found for: LDLDIRECT  ?  ?Radiology: DG Chest 2 View ? ?Result Date: 07/25/2021 ?CLINICAL DATA:  sob EXAM: CHEST - 2 VIEW COMPARISON:  Chest radiograph dated January 13, 2013 FINDINGS: The cardiomediastinal silhouette is enlarged in contour. No  pleural effusion. No pneumothorax. There are diffuse interstitial opacities with pulmonary vascular congestion and peribronchial cuffing. There are several more focal consolidative opacities noted early. Visualized abdomen is unremarkable. Multilevel degenerative changes of the thoracic spine. IMPRESSION: 1. Constellation of findings likely reflect pulmonary edema with areas of confluent atelectasis given patient history. Given nodular appearance of several of these areas, recommend follow-up PA and lateral radiograph 3-4 weeks after appropriate treatment to assess for resolution and exclude underlying malignancy. Electronically Signed   By: Valentino Saxon M.D.   On:  07/25/2021 12:00  ? ?CT Angio Chest PE W and/or Wo Contrast ? ?Result Date: 07/25/2021 ?CLINICAL DATA:  Nonradiating chest pain and chest heaviness, shortness of breath for several weeks EXAM: CT ANGIOGRAPHY CHEST WITH CONTRAST TECHNIQUE: Multidetector CT imaging of the chest was performed using the standard protocol during bolus administration of intravenous contrast. Multiplanar CT image reconstructions and MIPs were obtained to evaluate the vascular anatomy. RADIATION DOSE REDUCTION: This exam was performed according to the departmental dose-optimization program which includes automated exposure control, adjustment of the mA and/or kV according to patient size and/or use of iterative reconstruction technique. CONTRAST:  165m OMNIPAQUE IOHEXOL 350 MG/ML SOLN COMPARISON:  07/25/2021 FINDINGS: Cardiovascular: This is a technically adequate evaluation of the pulmonary vasculature. No filling defects or pulmonary emboli. Heart is unremarkable without pericardial effusion. Normal caliber of the thoracic aorta. Mediastinum/Nodes: No enlarged mediastinal, hilar, or axillary lymph nodes. Thyroid gland, trachea, and esophagus demonstrate no significant findings. Lungs/Pleura: Small bilateral pleural effusions. There is bilateral perihilar airspace disease,  most pronounced within the right middle and bilateral lower lobes. Basilar predominant interlobular septal thickening. Overall, findings favor congestive heart failure over multifocal pneumonia. No pneum

## 2021-07-27 NOTE — Progress Notes (Signed)
Pt's rhythm converted back to afib, latest HR: 126bpm. It happened when pt was using the bedside commode at around 0145H. Amiodarone drip infusing at '30mg'$ /hr. Pt denied palpitations, chest pain or difficulty of breathing. NP Foust made aware. Pt is currently asleep, appears comfortable. ?

## 2021-07-28 DIAGNOSIS — I5033 Acute on chronic diastolic (congestive) heart failure: Secondary | ICD-10-CM

## 2021-07-28 LAB — MAGNESIUM: Magnesium: 2.2 mg/dL (ref 1.7–2.4)

## 2021-07-28 LAB — BASIC METABOLIC PANEL
Anion gap: 9 (ref 5–15)
BUN: 28 mg/dL — ABNORMAL HIGH (ref 6–20)
CO2: 26 mmol/L (ref 22–32)
Calcium: 8.7 mg/dL — ABNORMAL LOW (ref 8.9–10.3)
Chloride: 98 mmol/L (ref 98–111)
Creatinine, Ser: 1.13 mg/dL — ABNORMAL HIGH (ref 0.44–1.00)
GFR, Estimated: 60 mL/min — ABNORMAL LOW (ref >60)
Glucose, Bld: 134 mg/dL — ABNORMAL HIGH (ref 70–99)
Potassium: 4.1 mmol/L (ref 3.5–5.1)
Sodium: 133 mmol/L — ABNORMAL LOW (ref 135–145)

## 2021-07-28 LAB — ECHOCARDIOGRAM COMPLETE
AR max vel: 3.6 cm2
AV Area VTI: 2.94 cm2
AV Area mean vel: 2.98 cm2
AV Mean grad: 3 mmHg
AV Peak grad: 4.2 mmHg
Ao pk vel: 1.03 m/s
Area-P 1/2: 3.46 cm2
Height: 65 in
MV VTI: 2.91 cm2
S' Lateral: 3.54 cm
Weight: 6282.23 oz

## 2021-07-28 LAB — VITAMIN K1, SERUM: VITAMIN K1: 0.22 ng/mL (ref 0.10–2.20)

## 2021-07-28 LAB — VITAMIN E
Vitamin E (Alpha Tocopherol): 11 mg/L (ref 7.0–25.1)
Vitamin E(Gamma Tocopherol): 3 mg/L (ref 0.5–5.5)

## 2021-07-28 LAB — VITAMIN A: Vitamin A (Retinoic Acid): 40.6 ug/dL (ref 20.1–62.0)

## 2021-07-28 LAB — CBC
HCT: 43.6 % (ref 36.0–46.0)
Hemoglobin: 14.3 g/dL (ref 12.0–15.0)
MCH: 31.2 pg (ref 26.0–34.0)
MCHC: 32.8 g/dL (ref 30.0–36.0)
MCV: 95 fL (ref 80.0–100.0)
Platelets: 361 10*3/uL (ref 150–400)
RBC: 4.59 MIL/uL (ref 3.87–5.11)
RDW: 12.2 % (ref 11.5–15.5)
WBC: 8.2 10*3/uL (ref 4.0–10.5)
nRBC: 0 % (ref 0.0–0.2)

## 2021-07-28 LAB — PHOSPHORUS: Phosphorus: 4.8 mg/dL — ABNORMAL HIGH (ref 2.5–4.6)

## 2021-07-28 LAB — COPPER, SERUM: Copper: 193 ug/dL — ABNORMAL HIGH (ref 80–158)

## 2021-07-28 LAB — ZINC: Zinc: 73 ug/dL (ref 44–115)

## 2021-07-28 MED ORDER — AMIODARONE HCL 200 MG PO TABS: 200.0000 mg | ORAL_TABLET | Freq: Every day | ORAL | Status: DC

## 2021-07-28 MED ORDER — AMIODARONE HCL 200 MG PO TABS
200.0000 mg | ORAL_TABLET | Freq: Two times a day (BID) | ORAL | Status: DC
  Administered 2021-07-28: 200 mg via ORAL
  Filled 2021-07-28: qty 1

## 2021-07-28 MED ORDER — METOPROLOL TARTRATE 37.5 MG PO TABS: 37.5000 mg | ORAL_TABLET | Freq: Two times a day (BID) | ORAL | 1 refills | Status: AC

## 2021-07-28 MED ORDER — VITAMIN D 25 MCG (1000 UNIT) PO TABS
1000.0000 [IU] | ORAL_TABLET | Freq: Every day | ORAL | Status: DC
  Administered 2021-07-28: 1000 [IU] via ORAL
  Filled 2021-07-28: qty 1

## 2021-07-28 MED ORDER — AMIODARONE HCL 200 MG PO TABS: 200.0000 mg | ORAL_TABLET | Freq: Two times a day (BID) | ORAL | 1 refills | Status: DC

## 2021-07-28 MED ORDER — FUROSEMIDE 40 MG PO TABS
40.0000 mg | ORAL_TABLET | Freq: Every day | ORAL | 1 refills | Status: AC
Start: 2021-07-29 — End: ?

## 2021-07-28 MED ORDER — MAGNESIUM GLUCONATE 500 MG PO TABS: 500.0000 mg | ORAL_TABLET | Freq: Every day | ORAL | 0 refills | Status: DC

## 2021-07-28 MED ORDER — METOPROLOL TARTRATE 25 MG PO TABS
37.5000 mg | ORAL_TABLET | Freq: Two times a day (BID) | ORAL | Status: DC
  Administered 2021-07-28: 37.5 mg via ORAL
  Filled 2021-07-28: qty 2

## 2021-07-28 MED ORDER — FUROSEMIDE 40 MG PO TABS: 40.0000 mg | ORAL_TABLET | Freq: Every day | ORAL | Status: DC

## 2021-07-28 MED ORDER — VITAMIN D3 25 MCG PO TABS: 1000.0000 [IU] | ORAL_TABLET | Freq: Every day | ORAL | 0 refills | Status: DC

## 2021-07-28 MED ORDER — APIXABAN 5 MG PO TABS: 5.0000 mg | ORAL_TABLET | Freq: Two times a day (BID) | ORAL | 2 refills | Status: DC

## 2021-07-28 MED ORDER — APIXABAN 5 MG PO TABS
5.0000 mg | ORAL_TABLET | Freq: Two times a day (BID) | ORAL | Status: DC
  Administered 2021-07-28: 5 mg via ORAL
  Filled 2021-07-28: qty 1

## 2021-07-28 NOTE — Progress Notes (Signed)
Nutrition Follow-up ? ?DOCUMENTATION CODES:  ? ?Morbid obesity ? ?INTERVENTION:  ? ?-Liberalize diet to 2 gram sodium for wider variety of meal selections ?-Continue Ensure Max po BID, each supplement provides 150 kcal and 30 grams of protein. ?-Continue MVI with minerals BID ?-Continue 500 mg calcium carbonate TID ?-Draw and monitor labs for potential deficiencies for gastric sleeve: iron, folic acid, thiamine, zinc, copper, vitamin B-12, vitamin D, vitamin E, vitamin A, and vitamin K ?-Vitamin D low (28.35); will supplement 1000 mg vitamin D daily ? ?NUTRITION DIAGNOSIS:  ? ?Increased nutrient needs related to chronic illness (CHF) as evidenced by estimated needs. ? ?Ongoing ? ?GOAL:  ? ?Patient will meet greater than or equal to 90% of their needs ? ?Progressing  ? ?MONITOR:  ? ?PO intake, Supplement acceptance ? ?REASON FOR ASSESSMENT:  ? ?Rounds ?  ? ?ASSESSMENT:  ? ?Pt with h/o CHF, HTN, HLD, GERD, OA, mobid obesity s/p gastric sleeve followed by 130 lb weight loss, Asthma, OSA and childhood seizures. She presents with increased SOB/DOE along with chest pressure and some abdominal pain. ? ?Reviewed I/O's: -1.6 L x 24 hours and -6.9 L since admission ? ?UOP: 2.4 L x 24 hours ? ?Pt remains with good appetite. Noted meal completions 25-100%. Pt is consuming Ensure Max supplements.   ? ?Medications reviewed and include colace and lasix.  ? ?Labs reviewed: Na: 133. Iron, folic acid, zinc, and vitamin B-12 WDL. Copper: 193. Vitamin D: 28.35.  ? ?Diet Order:   ?Diet Order   ? ?       ?  Diet Heart Room service appropriate? Yes; Fluid consistency: Thin  Diet effective now       ?  ? ?  ?  ? ?  ? ? ?EDUCATION NEEDS:  ? ?No education needs have been identified at this time ? ?Skin:  Skin Assessment: Reviewed RN Assessment ? ?Last BM:  07/27/21 ? ?Height:  ? ?Ht Readings from Last 1 Encounters:  ?07/25/21 '5\' 5"'$  (1.651 m)  ? ? ?Weight:  ? ?Wt Readings from Last 1 Encounters:  ?07/28/21 (!) 178.1 kg  ? ? ?Ideal Body Weight:   56.8 kg ? ?BMI:  Body mass index is 65.35 kg/m?. ? ?Estimated Nutritional Needs:  ? ?Kcal:  1700-1900 ? ?Protein:  110-125 grams ? ?Fluid:  > 1.7 L ? ? ? ?Loistine Chance, RD, LDN, CDCES ?Registered Dietitian II ?Certified Diabetes Care and Education Specialist ?Please refer to [facility] for RD and/or RD on-call/weekend/after hours pager  ?

## 2021-07-28 NOTE — Progress Notes (Signed)
Pt heart rhythm is still in and out of afib rvr, Currently on afib rvr, HR 120s-130s. Pt asymptomatic. Denies palpitations, chest pain or difficulty of breathing. Amiodarone drip at '30mg'$ /hr. ?

## 2021-07-28 NOTE — TOC Initial Note (Signed)
Transition of Care (TOC) - Initial/Assessment Note  ? ? ?Patient Details  ?Name: Stacie Shah ?MRN: [id number] ?Date of Birth: 07/30/1970 ? ?Transition of Care (TOC) CM/SW Contact:    ?Laurena Slimmer, RN ?Phone Number: ?07/28/2021, 9:30 AM ? ?Clinical Narrative:                 ? ?TOC consulted for heart failure screen. Heart failure RN Delmar Landau has been informed.   ?  ?  ? ? ?Patient Goals and CMS Choice ?  ?  ?  ? ?Expected Discharge Plan and Services ?  ?  ?  ?  ?  ?                ?  ?  ?  ?  ?  ?  ?  ?  ?  ?  ? ?Prior Living Arrangements/Services ?  ?  ?  ?       ?  ?  ?  ?  ? ?Activities of Daily Living ?Home Assistive Devices/Equipment: Blood pressure cuff ?ADL Screening (condition at time of admission) ?Patient's cognitive ability adequate to safely complete daily activities?: Yes ?Is the patient deaf or have difficulty hearing?: No ?Does the patient have difficulty seeing, even when wearing glasses/contacts?: No ?Does the patient have difficulty concentrating, remembering, or making decisions?: No ?Patient able to express need for assistance with ADLs?: Yes ?Does the patient have difficulty dressing or bathing?: No ?Independently performs ADLs?: Yes (appropriate for developmental age) ?Does the patient have difficulty walking or climbing stairs?: Yes ?Weakness of Legs: Both ?Weakness of Arms/Hands: None ? ?Permission Sought/Granted ?  ?  ?   ?   ?   ?   ? ?Emotional Assessment ?  ?  ?  ?  ?  ?  ? ?Admission diagnosis:  Shortness of breath [R06.02] ?CHF (congestive heart failure) ([place]) [I50.9] ?Acute on chronic diastolic congestive heart failure ([place]) [I50.33] ?Patient Active Problem List  ? Diagnosis Date Noted  ? CHF (congestive heart failure) ([place]) 07/25/2021  ? HTN (hypertension) 07/25/2021  ? HLD (hyperlipidemia) 07/25/2021  ? GERD (gastroesophageal reflux disease) 07/25/2021  ? Seizure ([place]) 07/25/2021  ? OSA (obstructive sleep apnea) 07/25/2021  ? Chronic atrial fibrillation with RVR ([place]) 07/25/2021   ? ?PCP:  [facility] ?Pharmacy:   ?[place], [place] [facility] [place] ?[address] ?[place] [address] ?Phone: [phone] Fax: [phone] ? ? ? ? ?Social Determinants of Health (SDOH) Interventions ?  ? ?Readmission Risk Interventions ?   ? View : No data to display.  ?  ?  ?  ? ? ? ?

## 2021-07-28 NOTE — Progress Notes (Signed)
?[facility] CONSULT [facility]  ? ?    ?Patient ID: ?[place] ?MRN: [id number] ?DOB/AGE: November 17, 1970 51 y.o. ? ?Admit date: 07/25/2021 ?Referring Physician Dr. Shawna Clamp ?Primary Physician Dr. Sara Chu  ?Primary Cardiologist Dr. Serafina Royals (last seen 2019)  ?Reason for Consultation AF RVR, CHF ? ?HPI: Stacie Shah is a 51yoF with a past medical history of morbid obesity s/p history of gastric sleeve 2020 (current BMI 66), HFpEF, obstructive sleep apnea, asthma, hypertension, hyperlipidemia, prediabetes, history of TIA, who presented to [facility] ED 07/25/2021 with worsening dyspnea on exertion and chest discomfort.  Cardiology is consulted for further assistance with her new onset A-fib and diastolic CHF. ? ?Interval History: ?-continues to feel better from a breathing standpoint, asymptomatic with her episodes of AF with RVR. No chest pain. Questions when she can be discharged ?-in and out of AF with rates in the 120s and sinus  ?-diuresed well ?-echo  ? ?Review of systems complete and found to be negative unless listed above  ? ? ? ?Past Medical History:  ?Diagnosis Date  ? Arthritis   ? Asthma   ? CHF (congestive heart failure) ([place])   ? Depression   ? GERD (gastroesophageal reflux disease)   ? Hypercholesteremia   ? Hypertension   ? Seizure ([place])   ? as a child  ? Sleep apnea   ?  ?Past Surgical History:  ?Procedure Laterality Date  ? ABDOMINAL SURGERY    ? Gastric Sleeve  ? CATARACT EXTRACTION W/PHACO Right 05/24/2019  ? Procedure: CATARACT EXTRACTION PHACO AND INTRAOCULAR LENS PLACEMENT ([place]) RIGHT DIABETIC;  Surgeon: Birder Robson, MD;  Location: [facility] ORS;  Service: Ophthalmology;  Laterality: Right;  [place] 00:23.6 ?CDE 1.88 ?Fluid Pack Lot # [id number]  ? CHOLECYSTECTOMY    ? FOOT SURGERY    ? cyst removal from heel  ? REPEAT CESAREAN SECTION    ? c-section x3  ? TYMPANOSTOMY    ?  ?Medications Prior to Admission  ?Medication Sig Dispense Refill Last Dose  ? acetaminophen (TYLENOL) 500 MG  tablet Take 500 mg by mouth every 6 (six) hours as needed for mild pain, moderate pain, fever or headache.   PRN at PRN  ? albuterol (VENTOLIN HFA) 108 (90 Base) MCG/ACT inhaler Inhale 2 puffs into the lungs every 4 (four) hours as needed for wheezing or shortness of breath.   07/24/2021  ? atorvastatin (LIPITOR) 10 MG tablet Take 10 mg by mouth daily.   07/24/2021  ? cyclobenzaprine (FLEXERIL) 5 MG tablet Take 5 mg by mouth in the morning, at noon, and at bedtime.   07/24/2021  ? DULoxetine (CYMBALTA) 60 MG capsule Take 60 mg by mouth 2 (two) times daily.    07/25/2021 at am  ? lisinopril (ZESTRIL) 10 MG tablet Take 10 mg by mouth daily.    07/24/2021  ? magnesium oxide (MAG-OX) 400 MG tablet Take 400 mg by mouth every other day.   07/24/2021  ? norethindrone (AYGESTIN) 5 MG tablet Take 10-15 mg by mouth daily.   07/24/2021  ? omeprazole (PRILOSEC) 20 MG capsule Take 20 mg by mouth daily.   07/24/2021 at PRN  ? traZODone (DESYREL) 150 MG tablet Take 150 mg by mouth at bedtime.    07/24/2021 at NIGHT  ? fluticasone (FLONASE) 50 MCG/ACT nasal spray Place 2 sprays into both nostrils daily as needed.  (Patient not taking: Reported on 07/25/2021)   Not Taking  ? furosemide (LASIX) 40 MG tablet Take 40 mg by mouth  as needed. (Patient not taking: Reported on 07/25/2021)   Not Taking  ? topiramate (TOPAMAX) 100 MG tablet Take 100 mg by mouth daily. (Patient not taking: Reported on 07/25/2021)   Not Taking  ? ?Social History  ? ?Socioeconomic History  ? Marital status: Divorced  ?  Spouse name: Not on file  ? Number of children: Not on file  ? Years of education: Not on file  ? Highest education level: Not on file  ?Occupational History  ? Not on file  ?Tobacco Use  ? Smoking status: Never  ? Smokeless tobacco: Never  ?Substance and Sexual Activity  ? Alcohol use: Yes  ?  Comment: socially   ? Drug use: No  ? Sexual activity: Not on file  ?Other Topics Concern  ? Not on file  ?Social History Narrative  ? Not on file  ? ?Social  Determinants of Health  ? ?Financial Resource Strain: Not on file  ?Food Insecurity: Not on file  ?Transportation Needs: Not on file  ?Physical Activity: Not on file  ?Stress: Not on file  ?Social Connections: Not on file  ?Intimate Partner Violence: Not on file  ?  ?History reviewed. No pertinent family history.  ? ? ?PHYSICAL EXAM ?General: Obese Caucasian female, well nourished, in no acute distress.  Sitting at incline in PCU bed without family at bedside ?HEENT:  Normocephalic and atraumatic. ?Neck:  No JVD.  ?Lungs: Normal respiratory effort on oxygen by nasal cannula. Clear bilaterally to auscultation anteriorly. No wheezes, crackles, rhonchi.  ?Heart: regular rate and rhythm Normal S1 and S2 without gallops or murmurs. Radial & DP pulses 2+ bilaterally. ?Abdomen: morbidly obese appearing.  ?Msk: Normal strength and tone for age. ?Extremities: Warm and well perfused. No clubbing, cyanosis. Trace bilateral LE edema ?Neuro: Alert and oriented X 3. ?Psych:  Answers questions appropriately.  ? ?Labs: ?  ?Lab Results  ?Component Value Date  ? WBC 8.2 07/28/2021  ? HGB 14.3 07/28/2021  ? HCT 43.6 07/28/2021  ? MCV 95.0 07/28/2021  ? PLT 361 07/28/2021  ?  ?Recent Labs  ?Lab 07/28/21 ?0981  ?NA 133*  ?K 4.1  ?CL 98  ?CO2 26  ?BUN 28*  ?CREATININE 1.13*  ?CALCIUM 8.7*  ?GLUCOSE 134*  ? ? ?Lab Results  ?Component Value Date  ? CKTOTAL 59 01/13/2013  ? CKMB < 0.5 (L) 01/13/2013  ? TROPONINI < 0.02 01/13/2013  ? ?  ?Lab Results  ?Component Value Date  ? CHOL 149 01/13/2013  ? ?Lab Results  ?Component Value Date  ? HDL 35 (L) 01/13/2013  ? ?Lab Results  ?Component Value Date  ? [place] 82 01/13/2013  ? ?Lab Results  ?Component Value Date  ? TRIG 162 01/13/2013  ? ?No results found for: CHOLHDL ?No results found for: LDLDIRECT  ?  ?Radiology: DG Chest 2 View ? ?Result Date: 07/25/2021 ?CLINICAL DATA:  sob EXAM: CHEST - 2 VIEW COMPARISON:  Chest radiograph dated January 13, 2013 FINDINGS: The cardiomediastinal silhouette is  enlarged in contour. No pleural effusion. No pneumothorax. There are diffuse interstitial opacities with pulmonary vascular congestion and peribronchial cuffing. There are several more focal consolidative opacities noted early. Visualized abdomen is unremarkable. Multilevel degenerative changes of the thoracic spine. IMPRESSION: 1. Constellation of findings likely reflect pulmonary edema with areas of confluent atelectasis given patient history. Given nodular appearance of several of these areas, recommend follow-up PA and lateral radiograph 3-4 weeks after appropriate treatment to assess for resolution and exclude underlying malignancy. Electronically Signed  By: Valentino Saxon M.D.   On: 07/25/2021 12:00  ? ?CT Angio Chest PE W and/or Wo Contrast ? ?Result Date: 07/25/2021 ?CLINICAL DATA:  Nonradiating chest pain and chest heaviness, shortness of breath for several weeks EXAM: CT ANGIOGRAPHY CHEST WITH CONTRAST TECHNIQUE: Multidetector CT imaging of the chest was performed using the standard protocol during bolus administration of intravenous contrast. Multiplanar CT image reconstructions and MIPs were obtained to evaluate the vascular anatomy. RADIATION DOSE REDUCTION: This exam was performed according to the departmental dose-optimization program which includes automated exposure control, adjustment of the mA and/or kV according to patient size and/or use of iterative reconstruction technique. CONTRAST:  18m OMNIPAQUE IOHEXOL 350 MG/ML SOLN COMPARISON:  07/25/2021 FINDINGS: Cardiovascular: This is a technically adequate evaluation of the pulmonary vasculature. No filling defects or pulmonary emboli. Heart is unremarkable without pericardial effusion. Normal caliber of the thoracic aorta. Mediastinum/Nodes: No enlarged mediastinal, hilar, or axillary lymph nodes. Thyroid gland, trachea, and esophagus demonstrate no significant findings. Lungs/Pleura: Small bilateral pleural effusions. There is bilateral  perihilar airspace disease, most pronounced within the right middle and bilateral lower lobes. Basilar predominant interlobular septal thickening. Overall, findings favor congestive heart failure over multifocal

## 2021-07-28 NOTE — Discharge Summary (Signed)
?Physician Discharge Summary ?  ?Patient: Stacie Shah MRN: [id number] DOB: 09-May-1970  ?Admit date:     07/25/2021  ?Discharge date: 07/28/21  ?Discharge Physician: Fritzi Mandes  ? ?PCP: [facility]  ? ?Recommendations at discharge:  ? ?follow-up PCP in 1 to 2 week ?Dr. Nehemiah Massed in one week ? ?Discharge Diagnoses: ?acute on chronic diastolic congestive heart failure ?new onset a fib with RVR ?morbid obesity/suspected sleep apnea ? ?Hospital Course: ?51 years old female with history of CHF diagnosed in 2011 in [place] where she was hospitalized and treated, history of hypertension, hyperlipidemia, GERD, obstructive sleep apnea, morbid obesity up to almost 500 pounds, s/p gastric sleeve followed by 130 pound weight loss, asthma, OSA, childhood seizures, history of metorrhagia for which she is about to have a hysterectomy but is currently medically managed.  She reports she had increased shortness of breath along with chest pressure and some abdominal pain,  she presented in the ED for evaluation.  She was tachypneic had bilateral basilar rales on auscultation.  BNP 283.  CTA chest negative for PE but consistent with pulmonary edema.  Patient is started on IV diuresis. ?Patient admitted for acute on chronic diastolic CHF and A-fib with RVR requiring amiodarone gtt ? ?Assessment and Plan: ?* CHF (congestive heart failure) ([place]) ?--Patient with pulmonary edema by CXR and CTA. Minimal elevation in BNP.  ?-- Has h/o CHF in 2011 which was treated with ACE and furosemide.  ?--She has not had cardiology f/u and was told that after gastric sleeve she could d/c diuretic. ?-- Nuclear Stress in 2012 was negative.  ?--echo--showed EF 55% ?--coont po lasix, BB ?--hold lisinopril per cards rec--can consider starting it as outpatient  ? ?Chronic atrial fibrillation with RVR ([facility]) ?While in ED patient developed tachycardia to 170. EKG revealed a. Fib with RVR. ED-MD order metoprolol which did not control rate. ?--  Patient was on amiodarone now switch to oral amiodarone per cardiology. ?-- Eliquis started ?-- tolerating beta-blockers. ?-- Patient will follow-up with Dr. Nehemiah Massed ? ? ?HTN (hypertension) ?-- Continue beta-blockers ?-- BP stable ?-- holding lisinopril for now ? ?OSA (obstructive sleep apnea) ?Patient is for testing in July as outpatient. She has not been on CPAP. ? ?Seizure ([place]) ?Childhood seizures. NO seizure in many years. No treatment.  ? ?GERD (gastroesophageal reflux disease) ?Continue PPI ? ?HLD (hyperlipidemia) ?Continue home meds ? ? ?overall hemodynamically stable. Patient ambulated around the nurses station sats remain more than 93%. Okay from cardiology standpoint for discharge. Discharge plan discussed with patient she voiced understanding. ? ?  ? ? ?Consultants: cardiology ?Procedures performed: none  ?Disposition: Home ?Diet recommendation:  ?Discharge Diet Orders (From admission, onward)  ? ?  Start     Ordered  ? 07/28/21 0000  Diet - low sodium heart healthy       ? 07/28/21 1417  ? ?  ?  ? ?  ? ?Cardiac diet ?DISCHARGE MEDICATION: ?Allergies as of 07/28/2021   ? ?   Reactions  ? Sulfa Antibiotics Rash  ? ?  ? ?  ?Medication List  ?  ? ?STOP taking these medications   ? ?fluticasone 50 MCG/ACT nasal spray ?Commonly known as: FLONASE ?  ?lisinopril 10 MG tablet ?Commonly known as: ZESTRIL ?  ?topiramate 100 MG tablet ?Commonly known as: TOPAMAX ?  ? ?  ? ?TAKE these medications   ? ?acetaminophen 500 MG tablet ?Commonly known as: TYLENOL ?Take 500 mg by mouth every 6 (six) hours  as needed for mild pain, moderate pain, fever or headache. ?  ?albuterol 108 (90 Base) MCG/ACT inhaler ?Commonly known as: VENTOLIN HFA ?Inhale 2 puffs into the lungs every 4 (four) hours as needed for wheezing or shortness of breath. ?  ?amiodarone 200 MG tablet ?Commonly known as: PACERONE ?Take 1 tablet (200 mg total) by mouth 2 (two) times daily. And from 08/02/2021 take 200 mg (1 tab) daily ?  ?apixaban 5 MG Tabs  tablet ?Commonly known as: ELIQUIS ?Take 1 tablet (5 mg total) by mouth 2 (two) times daily. ?  ?atorvastatin 10 MG tablet ?Commonly known as: LIPITOR ?Take 10 mg by mouth daily. ?  ?cyclobenzaprine 5 MG tablet ?Commonly known as: FLEXERIL ?Take 5 mg by mouth in the morning, at noon, and at bedtime. ?  ?DULoxetine 60 MG capsule ?Commonly known as: CYMBALTA ?Take 60 mg by mouth 2 (two) times daily. ?  ?furosemide 40 MG tablet ?Commonly known as: LASIX ?Take 1 tablet (40 mg total) by mouth daily. ?Start taking on: Jul 29, 2021 ?What changed:  ?when to take this ?reasons to take this ?  ?magnesium gluconate 500 MG tablet ?Commonly known as: MAGONATE ?Take 1 tablet (500 mg total) by mouth daily. ?Start taking on: Jul 29, 2021 ?  ?magnesium oxide 400 MG tablet ?Commonly known as: MAG-OX ?Take 400 mg by mouth every other day. ?  ?Metoprolol Tartrate 37.5 MG Tabs ?Take 37.5 mg by mouth 2 (two) times daily. ?  ?norethindrone 5 MG tablet ?Commonly known as: AYGESTIN ?Take 10-15 mg by mouth daily. ?  ?omeprazole 20 MG capsule ?Commonly known as: PRILOSEC ?Take 20 mg by mouth daily. ?  ?traZODone 150 MG tablet ?Commonly known as: DESYREL ?Take 150 mg by mouth at bedtime. ?  ?Vitamin D3 25 MCG tablet ?Commonly known as: Vitamin D ?Take 1 tablet (1,000 Units total) by mouth daily. ?Start taking on: Jul 29, 2021 ?  ? ?  ? ? Follow-up Information   ? ? Corey Skains, MD. Go in 1 week(s).   ?Specialty: Cardiology ?Contact information: ?[address] ?[facility] ?[place] [address] ?[phone] ? ? ?  ?  ? ? [place], [facility]. Schedule an appointment as soon as possible for a visit in 1 week(s).   ?Why: hospital f/u ?Contact information: ?[place] [address] ?[phone] ? ? ?  ?  ? ?  ?  ? ?  ? ?Discharge Exam: ?Filed Weights  ? 07/26/21 0259 07/27/21 0600 07/28/21 0402  ?Weight: (!) 181.7 kg (!) 178.1 kg (!) 178.1 kg  ? ? ? ?Condition at discharge: fair ? ?The results  of significant diagnostics from this hospitalization (including imaging, microbiology, ancillary and laboratory) are listed below for reference.  ? ?Imaging Studies: ?DG Chest 2 View ? ?Result Date: 07/25/2021 ?CLINICAL DATA:  sob EXAM: CHEST - 2 VIEW COMPARISON:  Chest radiograph dated January 13, 2013 FINDINGS: The cardiomediastinal silhouette is enlarged in contour. No pleural effusion. No pneumothorax. There are diffuse interstitial opacities with pulmonary vascular congestion and peribronchial cuffing. There are several more focal consolidative opacities noted early. Visualized abdomen is unremarkable. Multilevel degenerative changes of the thoracic spine. IMPRESSION: 1. Constellation of findings likely reflect pulmonary edema with areas of confluent atelectasis given patient history. Given nodular appearance of several of these areas, recommend follow-up PA and lateral radiograph 3-4 weeks after appropriate treatment to assess for resolution and exclude underlying malignancy. Electronically Signed   By: Valentino Saxon M.D.  On: 07/25/2021 12:00  ? ?CT Angio Chest PE W and/or Wo Contrast ? ?Result Date: 07/25/2021 ?CLINICAL DATA:  Nonradiating chest pain and chest heaviness, shortness of breath for several weeks EXAM: CT ANGIOGRAPHY CHEST WITH CONTRAST TECHNIQUE: Multidetector CT imaging of the chest was performed using the standard protocol during bolus administration of intravenous contrast. Multiplanar CT image reconstructions and MIPs were obtained to evaluate the vascular anatomy. RADIATION DOSE REDUCTION: This exam was performed according to the departmental dose-optimization program which includes automated exposure control, adjustment of the mA and/or kV according to patient size and/or use of iterative reconstruction technique. CONTRAST:  174m OMNIPAQUE IOHEXOL 350 MG/ML SOLN COMPARISON:  07/25/2021 FINDINGS: Cardiovascular: This is a technically adequate evaluation of the pulmonary vasculature. No  filling defects or pulmonary emboli. Heart is unremarkable without pericardial effusion. Normal caliber of the thoracic aorta. Mediastinum/Nodes: No enlarged mediastinal, hilar, or axillary lymph nodes. Thyr

## 2021-07-29 LAB — VITAMIN B1: Vitamin B1 (Thiamine): 115.4 nmol/L (ref 66.5–200.0)

## 2021-08-03 ENCOUNTER — Ambulatory Visit: Payer: Medicaid Other | Admitting: Family

## 2021-08-05 NOTE — Progress Notes (Signed)
Patient ID: Stacie Shah, female    DOB: [birth year], 51 y.o.   MRN: [id number]  HPI  Stacie Shah is a 51 y/o female with a history of atrial fibrillation, asthma, hyperlipidemia, anxiety, DM,  HTN, depression, GERD, seizure, sleep apnea and heart failure.   Echo report from 07/27/21 reviewed and showed an EF of 55-60% without LVH.   Admitted 07/25/21 due to increased shortness of breath along with chest pressure and some abdominal pain. Chest CTA negative for PE. Cardiology consult obtained. Initially given IV lasix with transition to oral diuretics. Found to be in AF RVR. Started on amiodarone and metoprolol. Discharged after 3 days.   She presents today for her initial visit with a chief complaint of minimal shortness of breath with moderate exertion. She describes this as chronic in nature. She has associated fatigue, chest tightness (when SOB), light-headedness (with sudden position changes), hip pan and anxiety along with this. She denies any difficulty sleeping, abdominal distention, palpitations, pedal edema, chest pain, wheezing or cough.   Does not have any scales for weighing. Adding very little salt to her food and is accustomed to reading food labels due to previous gastric surgery.   Was out of her metoprolol for a week after discharge because her pharmacy was unable to get it in. She just resumed it 2 days ago.   Having a sleep study done at [facility] 09/16/21.  Past Medical History:  Diagnosis Date   Anxiety    Arrhythmia    atrial fibrillation   Arthritis    Asthma    CHF (congestive heart failure) ([place])    Depression    Diabetes mellitus without complication ([place])    GERD (gastroesophageal reflux disease)    Hypercholesteremia    Hypertension    Seizure ([place])    as a child   Sleep apnea    Past Surgical History:  Procedure Laterality Date   ABDOMINAL SURGERY     Gastric Sleeve   CATARACT EXTRACTION W/PHACO Right 05/24/2019   Procedure: CATARACT EXTRACTION PHACO AND  INTRAOCULAR LENS PLACEMENT ([place]) RIGHT DIABETIC;  Surgeon: Birder Robson, MD;  Location: [facility] ORS;  Service: Ophthalmology;  Laterality: Right;  [place] 00:23.6 CDE 1.88 Fluid Pack Lot # [id number]   CHOLECYSTECTOMY     FOOT SURGERY     cyst removal from heel   REPEAT CESAREAN SECTION     c-section x3   TYMPANOSTOMY     History reviewed. No pertinent family history. Social History   Tobacco Use   Smoking status: Never   Smokeless tobacco: Never  Substance Use Topics   Alcohol use: Yes    Comment: socially    Allergies  Allergen Reactions   Sulfa Antibiotics Rash   Prior to Admission medications   Medication Sig Start Date End Date Taking? Authorizing Provider  acetaminophen (TYLENOL) 500 MG tablet Take 500 mg by mouth every 6 (six) hours as needed for mild pain, moderate pain, fever or headache.   Yes [provider]  albuterol (VENTOLIN HFA) 108 (90 Base) MCG/ACT inhaler Inhale 2 puffs into the lungs every 4 (four) hours as needed for wheezing or shortness of breath.   Yes [provider]  amiodarone (PACERONE) 200 MG tablet Take 1 tablet (200 mg total) by mouth 2 (two) times daily. And from 08/02/2021 take 200 mg (1 tab) daily 07/28/21  Yes Fritzi Mandes, MD  apixaban (ELIQUIS) 5 MG TABS tablet Take 1 tablet (5 mg total) by mouth 2 (two) times daily.  07/28/21  Yes Fritzi Mandes, MD  atorvastatin (LIPITOR) 10 MG tablet Take 10 mg by mouth daily.   Yes [provider]  cholecalciferol (VITAMIN D) 25 MCG tablet Take 1 tablet (1,000 Units total) by mouth daily. 07/29/21  Yes Fritzi Mandes, MD  cyclobenzaprine (FLEXERIL) 5 MG tablet Take 5 mg by mouth in the morning, at noon, and at bedtime. 03/11/20  Yes [provider]  DULoxetine (CYMBALTA) 60 MG capsule Take 60 mg by mouth 2 (two) times daily.    Yes [provider]  furosemide (LASIX) 40 MG tablet Take 1 tablet (40 mg total) by mouth daily. 07/29/21  Yes Fritzi Mandes, MD  magnesium oxide (MAG-OX) 400  MG tablet Take 400 mg by mouth every other day. 07/27/10  Yes [provider]  metoprolol tartrate 37.5 MG TABS Take 37.5 mg by mouth 2 (two) times daily. 07/28/21  Yes Fritzi Mandes, MD  norethindrone (AYGESTIN) 5 MG tablet Take 10-15 mg by mouth daily. 07/13/21  Yes [provider]  omeprazole (PRILOSEC) 20 MG capsule Take 20 mg by mouth daily. 08/31/15  Yes [provider]  traZODone (DESYREL) 150 MG tablet Take 150 mg by mouth at bedtime.    Yes [provider]  Semaglutide,0.25 or 0.'5MG'$ /DOS, (OZEMPIC, 0.25 OR 0.5 MG/DOSE,) 2 MG/1.5ML SOPN Inject into the skin. Patient not taking: Reported on 08/06/2021    [provider]   Review of Systems  Constitutional:  Positive for appetite change (has to eat small, frequent amounts) and fatigue.  HENT:  Negative for congestion, postnasal drip and sore throat.   Eyes: Negative.   Respiratory:  Positive for chest tightness and shortness of breath (with moderate exertion). Negative for cough and wheezing.   Cardiovascular:  Negative for chest pain, palpitations and leg swelling.  Gastrointestinal:  Negative for abdominal distention and abdominal pain.  Endocrine: Negative.   Genitourinary: Negative.   Musculoskeletal:  Positive for arthralgias (hip). Negative for back pain.  Skin: Negative.   Allergic/Immunologic: Negative.   Neurological:  Positive for light-headedness (when changing positions too quickly). Negative for dizziness.  Hematological:  Negative for adenopathy. Does not bruise/bleed easily.  Psychiatric/Behavioral:  Negative for dysphoric mood and sleep disturbance (sleeping on 2 pillows). The patient is nervous/anxious.    Vitals:   08/06/21 0825  BP: (!) 148/100  Pulse: 62  Resp: 18  SpO2: 97%  Weight: (!) 395 lb 4 oz (179.3 kg)  Height: '5\' 5"'$  (1.651 m)   Wt Readings from Last 3 Encounters:  08/06/21 (!) 395 lb 4 oz (179.3 kg)  07/28/21 (!) 392 lb 11.3 oz (178.1 kg)  05/24/19 (!) 337 lb  1.3 oz (152.9 kg)   Lab Results  Component Value Date   CREATININE 1.13 (H) 07/28/2021   CREATININE 1.22 (H) 07/27/2021   CREATININE 1.10 (H) 07/26/2021   Physical Exam Vitals and nursing note reviewed.  Constitutional:      Appearance: She is well-developed.  HENT:     Head: Normocephalic and atraumatic.  Cardiovascular:     Rate and Rhythm: Normal rate and regular rhythm.  Pulmonary:     Effort: Pulmonary effort is normal. No accessory muscle usage.     Breath sounds: No wheezing, rhonchi or rales.  Abdominal:     Palpations: Abdomen is soft.     Tenderness: There is no abdominal tenderness.  Musculoskeletal:     Cervical back: Normal range of motion and neck supple.     Right lower leg: No edema.  Left lower leg: No edema.  Skin:    General: Skin is warm and dry.  Neurological:     General: No focal deficit present.     Mental Status: She is alert and oriented to person, place, and time.  Psychiatric:        Mood and Affect: Mood is anxious.        Behavior: Behavior normal.   Assessment & Plan:  1: Chronic heart failure with preserved ejection fraction without structural changes- - NYHA class II - euvolemic today - scales provided today and she was instructed to start weighing daily and call for an overnight weight gain of > 2 pounds or a weekly weight gain of > 5 pounds - does had "some" salt to her food; encouraged to not add any salt to her food and to read food labels to keep daily sodium under '2000mg'$  - drinking tea and water but unsure of quantity - BNP 07/25/21 was 283.8  2: HTN- - BP elevated (148/100); just took medications right before coming to appointment - was out of metoprolol for 1 week due to pharmacy being unable to supply it; just resumed it 2 days ago - saw PCP @ [facility] 06/22/21 - BMP 07/28/21 reviewed and showed sodium 133, potassium 4.1, creatinine 1.13 & GFR 60  3: Atrial fibrillation- - to see cardiology Nehemiah Massed) - his office  number was supplied so she can call to schedule an appointment  4: Sleep apnea- - not wearing CPAP - has a sleep study scheduled 09/16/21 at [facility]  5: Morbid obesity- - had gastric bypass surgery 03/2018 with resultant weight loss of 130 pounds - unfortunately has regained 70-90 pounds back due to emotional eating - goes to weight loss clinic 08/2021 - is hoping that she can have another weight loss surgery   Medication bottles reviewed.   Return in 6 weeks, sooner if needed.

## 2021-08-06 ENCOUNTER — Ambulatory Visit: Payer: Medicaid Other | Attending: Family | Admitting: Family

## 2021-08-06 ENCOUNTER — Encounter: Payer: Self-pay | Admitting: Family

## 2021-08-06 VITALS — BP 148/100 | HR 62 | Resp 18 | Ht 65.0 in | Wt 395.2 lb

## 2021-08-06 DIAGNOSIS — G4730 Sleep apnea, unspecified: Secondary | ICD-10-CM | POA: Insufficient documentation

## 2021-08-06 DIAGNOSIS — Z9884 Bariatric surgery status: Secondary | ICD-10-CM | POA: Diagnosis not present

## 2021-08-06 DIAGNOSIS — E7800 Pure hypercholesterolemia, unspecified: Secondary | ICD-10-CM | POA: Insufficient documentation

## 2021-08-06 DIAGNOSIS — I10 Essential (primary) hypertension: Secondary | ICD-10-CM | POA: Diagnosis not present

## 2021-08-06 DIAGNOSIS — G4733 Obstructive sleep apnea (adult) (pediatric): Secondary | ICD-10-CM

## 2021-08-06 DIAGNOSIS — I110 Hypertensive heart disease with heart failure: Secondary | ICD-10-CM | POA: Diagnosis not present

## 2021-08-06 DIAGNOSIS — K219 Gastro-esophageal reflux disease without esophagitis: Secondary | ICD-10-CM | POA: Insufficient documentation

## 2021-08-06 DIAGNOSIS — E119 Type 2 diabetes mellitus without complications: Secondary | ICD-10-CM | POA: Insufficient documentation

## 2021-08-06 DIAGNOSIS — F418 Other specified anxiety disorders: Secondary | ICD-10-CM | POA: Insufficient documentation

## 2021-08-06 DIAGNOSIS — Z79899 Other long term (current) drug therapy: Secondary | ICD-10-CM | POA: Insufficient documentation

## 2021-08-06 DIAGNOSIS — I4891 Unspecified atrial fibrillation: Secondary | ICD-10-CM | POA: Insufficient documentation

## 2021-08-06 DIAGNOSIS — I480 Paroxysmal atrial fibrillation: Secondary | ICD-10-CM

## 2021-08-06 DIAGNOSIS — Z7901 Long term (current) use of anticoagulants: Secondary | ICD-10-CM | POA: Insufficient documentation

## 2021-08-06 DIAGNOSIS — I5032 Chronic diastolic (congestive) heart failure: Secondary | ICD-10-CM | POA: Diagnosis not present

## 2021-08-06 DIAGNOSIS — Z793 Long term (current) use of hormonal contraceptives: Secondary | ICD-10-CM | POA: Insufficient documentation

## 2021-08-06 DIAGNOSIS — J45909 Unspecified asthma, uncomplicated: Secondary | ICD-10-CM | POA: Diagnosis not present

## 2021-08-06 NOTE — Patient Instructions (Addendum)
Begin weighing daily and call for an overnight weight gain of 3 pounds or more or a weekly weight gain of more than 5 pounds.   If you have voicemail, please make sure your mailbox is cleaned out so that we may leave a message and please make sure to listen to any voicemails.    Call [facility] cardiology to schedule appointment [phone]   Keep daily sodium intake to '2000mg'$ . Try to keep daily fluid intake to 60-64 ounces daily

## 2021-09-05 ENCOUNTER — Emergency Department: Payer: Medicaid Other

## 2021-09-05 ENCOUNTER — Other Ambulatory Visit: Payer: Self-pay

## 2021-09-05 DIAGNOSIS — S4992XA Unspecified injury of left shoulder and upper arm, initial encounter: Secondary | ICD-10-CM | POA: Diagnosis present

## 2021-09-05 DIAGNOSIS — I110 Hypertensive heart disease with heart failure: Secondary | ICD-10-CM | POA: Diagnosis not present

## 2021-09-05 DIAGNOSIS — S40012A Contusion of left shoulder, initial encounter: Secondary | ICD-10-CM | POA: Insufficient documentation

## 2021-09-05 DIAGNOSIS — S20219A Contusion of unspecified front wall of thorax, initial encounter: Secondary | ICD-10-CM | POA: Insufficient documentation

## 2021-09-05 DIAGNOSIS — R109 Unspecified abdominal pain: Secondary | ICD-10-CM | POA: Insufficient documentation

## 2021-09-05 DIAGNOSIS — R221 Localized swelling, mass and lump, neck: Secondary | ICD-10-CM | POA: Diagnosis not present

## 2021-09-05 DIAGNOSIS — Y92410 Unspecified street and highway as the place of occurrence of the external cause: Secondary | ICD-10-CM | POA: Diagnosis not present

## 2021-09-05 DIAGNOSIS — J189 Pneumonia, unspecified organism: Secondary | ICD-10-CM | POA: Insufficient documentation

## 2021-09-05 DIAGNOSIS — Z7901 Long term (current) use of anticoagulants: Secondary | ICD-10-CM | POA: Diagnosis not present

## 2021-09-05 DIAGNOSIS — I509 Heart failure, unspecified: Secondary | ICD-10-CM | POA: Diagnosis not present

## 2021-09-05 DIAGNOSIS — Z23 Encounter for immunization: Secondary | ICD-10-CM | POA: Diagnosis not present

## 2021-09-06 ENCOUNTER — Emergency Department: Payer: Medicaid Other

## 2021-09-06 ENCOUNTER — Emergency Department
Admission: EM | Admit: 2021-09-06 | Discharge: 2021-09-06 | Disposition: A | Discharge status: Home / Self Care | Payer: Medicaid Other | Attending: Emergency Medicine | Admitting: Emergency Medicine

## 2021-09-06 DIAGNOSIS — J189 Pneumonia, unspecified organism: Secondary | ICD-10-CM

## 2021-09-06 LAB — CBC WITH DIFFERENTIAL/PLATELET
Abs Immature Granulocytes: 0.02 10*3/uL (ref 0.00–0.07)
Basophils Absolute: 0.1 10*3/uL (ref 0.0–0.1)
Basophils Relative: 1 %
Eosinophils Absolute: 0.4 10*3/uL (ref 0.0–0.5)
Eosinophils Relative: 4 %
HCT: 44.4 % (ref 36.0–46.0)
Hemoglobin: 14.2 g/dL (ref 12.0–15.0)
Immature Granulocytes: 0 %
Lymphocytes Relative: 33 %
Lymphs Abs: 2.8 10*3/uL (ref 0.7–4.0)
MCH: 31.1 pg (ref 26.0–34.0)
MCHC: 32 g/dL (ref 30.0–36.0)
MCV: 97.2 fL (ref 80.0–100.0)
Monocytes Absolute: 0.6 10*3/uL (ref 0.1–1.0)
Monocytes Relative: 7 %
Neutro Abs: 4.8 10*3/uL (ref 1.7–7.7)
Neutrophils Relative %: 55 %
Platelets: 236 10*3/uL (ref 150–400)
RBC: 4.57 MIL/uL (ref 3.87–5.11)
RDW: 12.4 % (ref 11.5–15.5)
WBC: 8.6 10*3/uL (ref 4.0–10.5)
nRBC: 0 % (ref 0.0–0.2)

## 2021-09-06 LAB — COMPREHENSIVE METABOLIC PANEL
ALT: 33 U/L (ref 0–44)
AST: 33 U/L (ref 15–41)
Albumin: 3.6 g/dL (ref 3.5–5.0)
Alkaline Phosphatase: 61 U/L (ref 38–126)
Anion gap: 9 (ref 5–15)
BUN: 17 mg/dL (ref 6–20)
CO2: 24 mmol/L (ref 22–32)
Calcium: 9.2 mg/dL (ref 8.9–10.3)
Chloride: 105 mmol/L (ref 98–111)
Creatinine, Ser: 1.09 mg/dL — ABNORMAL HIGH (ref 0.44–1.00)
GFR, Estimated: >60 mL/min (ref >60)
Glucose, Bld: 140 mg/dL — ABNORMAL HIGH (ref 70–99)
Potassium: 4.3 mmol/L (ref 3.5–5.1)
Sodium: 138 mmol/L (ref 135–145)
Total Bilirubin: 1.1 mg/dL (ref 0.3–1.2)
Total Protein: 7.6 g/dL (ref 6.5–8.1)

## 2021-09-06 LAB — URINALYSIS, ROUTINE W REFLEX MICROSCOPIC
Bilirubin Urine: NEGATIVE
Glucose, UA: NEGATIVE mg/dL
Ketones, ur: NEGATIVE mg/dL
Nitrite: NEGATIVE
Protein, ur: 100 mg/dL — AB
Specific Gravity, Urine: 1.035 — ABNORMAL HIGH (ref 1.005–1.030)
pH: 5 (ref 5.0–8.0)

## 2021-09-06 LAB — LIPASE, BLOOD: Lipase: 45 U/L (ref 11–51)

## 2021-09-06 MED ORDER — DOXYCYCLINE MONOHYDRATE 100 MG PO CAPS: 100.0000 mg | ORAL_CAPSULE | Freq: Two times a day (BID) | ORAL | 0 refills | Status: AC

## 2021-09-06 MED ORDER — TETANUS-DIPHTH-ACELL PERTUSSIS 5-2.5-18.5 LF-MCG/0.5 IM SUSY
0.5000 mL | PREFILLED_SYRINGE | Freq: Once | INTRAMUSCULAR | Status: AC
Start: 2021-09-06 — End: 2021-09-06
  Administered 2021-09-06: 0.5 mL via INTRAMUSCULAR
  Filled 2021-09-06: qty 0.5

## 2021-09-06 MED ORDER — OXYCODONE HCL 5 MG PO TABS
5.0000 mg | ORAL_TABLET | Freq: Once | ORAL | Status: AC
  Administered 2021-09-06: 5 mg via ORAL
  Filled 2021-09-06: qty 1

## 2021-09-06 MED ORDER — OXYCODONE HCL 5 MG PO TABS: 5.0000 mg | ORAL_TABLET | Freq: Four times a day (QID) | ORAL | 0 refills | Status: AC | PRN

## 2021-09-06 MED ORDER — IOHEXOL 350 MG/ML SOLN
100.0000 mL | Freq: Once | INTRAVENOUS | Status: AC | PRN
  Administered 2021-09-06: 100 mL via INTRAVENOUS

## 2021-09-07 LAB — URINE CULTURE

## 2021-09-15 DIAGNOSIS — G8929 Other chronic pain: Secondary | ICD-10-CM | POA: Insufficient documentation

## 2021-09-15 DIAGNOSIS — M25512 Pain in left shoulder: Secondary | ICD-10-CM | POA: Insufficient documentation

## 2021-09-15 DIAGNOSIS — M25532 Pain in left wrist: Secondary | ICD-10-CM | POA: Insufficient documentation

## 2021-09-15 DIAGNOSIS — S46012A Strain of muscle(s) and tendon(s) of the rotator cuff of left shoulder, initial encounter: Secondary | ICD-10-CM | POA: Insufficient documentation

## 2021-09-16 ENCOUNTER — Ambulatory Visit: Payer: Medicaid Other | Admitting: Family

## 2021-10-01 DIAGNOSIS — I4891 Unspecified atrial fibrillation: Secondary | ICD-10-CM | POA: Insufficient documentation

## 2021-10-08 ENCOUNTER — Ambulatory Visit: Payer: Medicaid Other | Admitting: Family
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
## 2021-10-11 ENCOUNTER — Ambulatory Visit: Payer: Medicaid Other | Attending: Family | Admitting: Family

## 2021-10-11 ENCOUNTER — Encounter: Payer: Self-pay | Admitting: Family

## 2021-10-11 VITALS — BP 138/96 | HR 86 | Resp 20 | Ht 65.0 in | Wt >= 6400 oz

## 2021-10-11 DIAGNOSIS — I480 Paroxysmal atrial fibrillation: Secondary | ICD-10-CM | POA: Diagnosis not present

## 2021-10-11 DIAGNOSIS — K219 Gastro-esophageal reflux disease without esophagitis: Secondary | ICD-10-CM | POA: Insufficient documentation

## 2021-10-11 DIAGNOSIS — G4730 Sleep apnea, unspecified: Secondary | ICD-10-CM | POA: Diagnosis not present

## 2021-10-11 DIAGNOSIS — R002 Palpitations: Secondary | ICD-10-CM | POA: Diagnosis not present

## 2021-10-11 DIAGNOSIS — R0789 Other chest pain: Secondary | ICD-10-CM | POA: Insufficient documentation

## 2021-10-11 DIAGNOSIS — F32A Depression, unspecified: Secondary | ICD-10-CM | POA: Insufficient documentation

## 2021-10-11 DIAGNOSIS — Z9884 Bariatric surgery status: Secondary | ICD-10-CM | POA: Insufficient documentation

## 2021-10-11 DIAGNOSIS — I4891 Unspecified atrial fibrillation: Secondary | ICD-10-CM | POA: Diagnosis not present

## 2021-10-11 DIAGNOSIS — I110 Hypertensive heart disease with heart failure: Secondary | ICD-10-CM | POA: Diagnosis not present

## 2021-10-11 DIAGNOSIS — G4733 Obstructive sleep apnea (adult) (pediatric): Secondary | ICD-10-CM

## 2021-10-11 DIAGNOSIS — I5032 Chronic diastolic (congestive) heart failure: Secondary | ICD-10-CM | POA: Diagnosis present

## 2021-10-11 DIAGNOSIS — J45909 Unspecified asthma, uncomplicated: Secondary | ICD-10-CM | POA: Diagnosis not present

## 2021-10-11 DIAGNOSIS — F419 Anxiety disorder, unspecified: Secondary | ICD-10-CM | POA: Diagnosis not present

## 2021-10-11 DIAGNOSIS — I10 Essential (primary) hypertension: Secondary | ICD-10-CM | POA: Diagnosis not present

## 2021-10-11 DIAGNOSIS — Z79899 Other long term (current) drug therapy: Secondary | ICD-10-CM | POA: Diagnosis not present

## 2021-10-11 DIAGNOSIS — R42 Dizziness and giddiness: Secondary | ICD-10-CM | POA: Diagnosis not present

## 2021-10-11 DIAGNOSIS — R109 Unspecified abdominal pain: Secondary | ICD-10-CM | POA: Insufficient documentation

## 2021-10-11 DIAGNOSIS — E119 Type 2 diabetes mellitus without complications: Secondary | ICD-10-CM | POA: Insufficient documentation

## 2021-10-11 NOTE — Patient Instructions (Signed)
Continue weighing daily and call for an overnight weight gain of 3 pounds or more or a weekly weight gain of more than 5 pounds.   If you have voicemail, please make sure your mailbox is cleaned out so that we may leave a message and please make sure to listen to any voicemails.     

## 2021-10-25 DIAGNOSIS — R7303 Prediabetes: Secondary | ICD-10-CM | POA: Insufficient documentation

## 2022-02-15 DIAGNOSIS — M7552 Bursitis of left shoulder: Secondary | ICD-10-CM | POA: Insufficient documentation

## 2022-02-15 DIAGNOSIS — M67814 Other specified disorders of tendon, left shoulder: Secondary | ICD-10-CM | POA: Insufficient documentation
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
## 2022-03-29 ENCOUNTER — Ambulatory Visit: Payer: Medicaid Other | Admitting: Family

## 2022-03-29 ENCOUNTER — Other Ambulatory Visit (HOSPITAL_COMMUNITY): Payer: Self-pay

## 2022-03-29 ENCOUNTER — Telehealth: Payer: Self-pay | Admitting: Family

## 2022-03-29 NOTE — Telephone Encounter (Signed)
Patient did not show for her [facility] Failure [facility] appointment on 03/29/22. Will attempt to reschedule.

## 2022-04-11 ENCOUNTER — Ambulatory Visit: Payer: Medicaid Other | Admitting: Internal Medicine

## 2022-04-11 NOTE — Progress Notes (Signed)
Sleep Medicine   Office Visit  Patient Name: Stacie Shah DOB: 12/07/1963 MRN [id number]    Chief Complaint: ***  Brief History:  Stacie presents for initial sleep consult with a *** history of ***. Sleep quality is ***. This is noted *** nights. The patient's bed partner reports  *** at night. The patient relates the following symptoms: *** are also present. The patient goes to sleep at *** and wakes up at ***.   Sleep quality is *** when outside home environment.  Patient has noted *** of her legs at night.  The patient  relates *** behavior during the night.  The patient *** a history of psychiatric problems. The Epworth Sleepiness Score is *** out of 24 .  The patient relates  Cardiovascular risk factors include: *** The patient reports ***    ROS  General: (-) fever, (-) chills, (-) night sweat Nose and Sinuses: (-) nasal stuffiness or itchiness, (-) postnasal drip, (-) nosebleeds, (-) sinus trouble. Mouth and Throat: (-) sore throat, (-) hoarseness. Neck: (-) swollen glands, (-) enlarged thyroid, (-) neck pain. Respiratory: *** cough, *** shortness of breath, *** wheezing. Neurologic: *** numbness, *** tingling. Psychiatric: *** anxiety, *** depression Sleep behavior: ***sleep paralysis ***hypnogogic hallucinations ***dream enactment      ***vivid dreams ***cataplexy ***night terrors ***sleep walking   Current Medication: No outpatient encounter medications on file as of 05/05/2022.   No facility-administered encounter medications on file as of 05/05/2022.    Surgical History: *** The histories are not reviewed yet. Please review them in the "History" navigator section and refresh this SmartLink.  Medical History: No past medical history on file.  Family History: Non contributory to the present illness  Social History: Social History   Socioeconomic History   Marital status: Not on file    Spouse name: Not on file   Number of children: Not on file   Years of  education: Not on file   Highest education level: Not on file  Occupational History   Not on file  Tobacco Use   Smoking status: Not on file   Smokeless tobacco: Not on file  Substance and Sexual Activity   Alcohol use: Not on file   Drug use: Not on file   Sexual activity: Not on file  Other Topics Concern   Not on file  Social History Narrative   Not on file   Social Determinants of Health   Financial Resource Strain: Not on file  Food Insecurity: Not on file  Transportation Needs: Not on file  Physical Activity: Not on file  Stress: Not on file  Social Connections: Not on file  Intimate Partner Violence: Not on file    Vital Signs: There were no vitals taken for this visit. There is no height or weight on file to calculate BMI.   Examination: General Appearance: The patient is well-developed, well-nourished, and in no distress. Neck Circumference: *** Skin: Gross inspection of skin unremarkable. Head: normocephalic, no gross deformities. Eyes: no gross deformities noted. ENT: ears appear grossly normal Neurologic: Alert and oriented. No involuntary movements.    STOP BANG RISK ASSESSMENT S (snore) Have you been told that you snore?     YES/N   T (tired) Are you often tired, fatigued, or sleepy during the day?   YES/NO  O (obstruction) Do you stop breathing, choke, or gasp during sleep? YES/NO   P (pressure) Do you have or are you being treated for high blood pressure? YES/NO   B (

## 2022-05-23 DIAGNOSIS — M654 Radial styloid tenosynovitis [de Quervain]: Secondary | ICD-10-CM | POA: Insufficient documentation

## 2022-06-20 ENCOUNTER — Ambulatory Visit (INDEPENDENT_AMBULATORY_CARE_PROVIDER_SITE_OTHER): Payer: Medicaid Other | Admitting: Internal Medicine

## 2022-06-20 VITALS — BP 146/84 | HR 79 | Resp 16 | Ht 65.0 in | Wt 384.0 lb

## 2022-06-20 DIAGNOSIS — Z6841 Body Mass Index (BMI) 40.0 and over, adult: Secondary | ICD-10-CM | POA: Diagnosis not present

## 2022-06-20 DIAGNOSIS — G4733 Obstructive sleep apnea (adult) (pediatric): Secondary | ICD-10-CM
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
## 2022-07-01 ENCOUNTER — Other Ambulatory Visit: Payer: Self-pay | Admitting: Physician Assistant

## 2022-07-01 DIAGNOSIS — M542 Cervicalgia: Secondary | ICD-10-CM

## 2022-07-01 DIAGNOSIS — R200 Anesthesia of skin: Secondary | ICD-10-CM

## 2022-07-01 DIAGNOSIS — G4486 Cervicogenic headache: Secondary | ICD-10-CM

## 2022-07-07 ENCOUNTER — Encounter: Payer: Self-pay | Admitting: Physician Assistant

## 2022-07-14 ENCOUNTER — Other Ambulatory Visit: Payer: Medicaid Other

## 2022-07-28 ENCOUNTER — Ambulatory Visit
Admission: RE | Admit: 2022-07-28 | Discharge: 2022-07-28 | Disposition: A | Discharge status: Home / Self Care | Payer: Medicaid Other | Source: Ambulatory Visit | Attending: Physician Assistant | Admitting: Physician Assistant

## 2022-07-28 DIAGNOSIS — M542 Cervicalgia: Secondary | ICD-10-CM

## 2022-07-28 DIAGNOSIS — G4486 Cervicogenic headache: Secondary | ICD-10-CM

## 2022-07-28 DIAGNOSIS — R200 Anesthesia of skin: Secondary | ICD-10-CM

## 2022-08-08 ENCOUNTER — Emergency Department: Payer: Medicaid Other

## 2022-08-08 ENCOUNTER — Encounter: Payer: Self-pay | Admitting: Emergency Medicine

## 2022-08-08 ENCOUNTER — Other Ambulatory Visit: Payer: Self-pay

## 2022-08-08 ENCOUNTER — Emergency Department
Admission: EM | Admit: 2022-08-08 | Discharge: 2022-08-08 | Disposition: A | Discharge status: Home / Self Care | Payer: Medicaid Other | Attending: Emergency Medicine | Admitting: Emergency Medicine

## 2022-08-08 DIAGNOSIS — J45901 Unspecified asthma with (acute) exacerbation: Secondary | ICD-10-CM | POA: Diagnosis not present

## 2022-08-08 DIAGNOSIS — Z20822 Contact with and (suspected) exposure to covid-19: Secondary | ICD-10-CM | POA: Insufficient documentation

## 2022-08-08 DIAGNOSIS — I509 Heart failure, unspecified: Secondary | ICD-10-CM | POA: Insufficient documentation

## 2022-08-08 DIAGNOSIS — J189 Pneumonia, unspecified organism: Secondary | ICD-10-CM | POA: Diagnosis not present

## 2022-08-08 DIAGNOSIS — R059 Cough, unspecified: Secondary | ICD-10-CM | POA: Diagnosis present

## 2022-08-08 DIAGNOSIS — R197 Diarrhea, unspecified: Secondary | ICD-10-CM | POA: Diagnosis not present

## 2022-08-08 DIAGNOSIS — R051 Acute cough: Secondary | ICD-10-CM

## 2022-08-08 LAB — D-DIMER, QUANTITATIVE: D-Dimer, Quant: 0.77 ug/mL-FEU — ABNORMAL HIGH (ref 0.00–0.50)

## 2022-08-08 LAB — COMPREHENSIVE METABOLIC PANEL
ALT: 19 U/L (ref 0–44)
AST: 25 U/L (ref 15–41)
Albumin: 3.1 g/dL — ABNORMAL LOW (ref 3.5–5.0)
Alkaline Phosphatase: 125 U/L (ref 38–126)
Anion gap: 9 (ref 5–15)
BUN: 12 mg/dL (ref 6–20)
CO2: 24 mmol/L (ref 22–32)
Calcium: 8.3 mg/dL — ABNORMAL LOW (ref 8.9–10.3)
Chloride: 101 mmol/L (ref 98–111)
Creatinine, Ser: 0.82 mg/dL (ref 0.44–1.00)
GFR, Estimated: >60 mL/min (ref >60)
Glucose, Bld: 114 mg/dL — ABNORMAL HIGH (ref 70–99)
Potassium: 4.2 mmol/L (ref 3.5–5.1)
Sodium: 134 mmol/L — ABNORMAL LOW (ref 135–145)
Total Bilirubin: 1.7 mg/dL — ABNORMAL HIGH (ref 0.3–1.2)
Total Protein: 7.6 g/dL (ref 6.5–8.1)

## 2022-08-08 LAB — CBC WITH DIFFERENTIAL/PLATELET
Abs Immature Granulocytes: 0.05 10*3/uL (ref 0.00–0.07)
Basophils Absolute: 0 10*3/uL (ref 0.0–0.1)
Basophils Relative: 0 %
Eosinophils Absolute: 0.1 10*3/uL (ref 0.0–0.5)
Eosinophils Relative: 1 %
HCT: 38.2 % (ref 36.0–46.0)
Hemoglobin: 12.6 g/dL (ref 12.0–15.0)
Immature Granulocytes: 1 %
Lymphocytes Relative: 14 %
Lymphs Abs: 1.3 10*3/uL (ref 0.7–4.0)
MCH: 32.7 pg (ref 26.0–34.0)
MCHC: 33 g/dL (ref 30.0–36.0)
MCV: 99.2 fL (ref 80.0–100.0)
Monocytes Absolute: 0.5 10*3/uL (ref 0.1–1.0)
Monocytes Relative: 5 %
Neutro Abs: 7.7 10*3/uL (ref 1.7–7.7)
Neutrophils Relative %: 79 %
Platelets: 270 10*3/uL (ref 150–400)
RBC: 3.85 MIL/uL — ABNORMAL LOW (ref 3.87–5.11)
RDW: 11.8 % (ref 11.5–15.5)
WBC: 9.8 10*3/uL (ref 4.0–10.5)
nRBC: 0 % (ref 0.0–0.2)

## 2022-08-08 LAB — BRAIN NATRIURETIC PEPTIDE: B Natriuretic Peptide: 27.5 pg/mL (ref 0.0–100.0)

## 2022-08-08 LAB — SARS CORONAVIRUS 2 BY RT PCR: SARS Coronavirus 2 by RT PCR: NEGATIVE

## 2022-08-08 MED ORDER — IOHEXOL 350 MG/ML SOLN
75.0000 mL | Freq: Once | INTRAVENOUS | Status: AC | PRN
  Administered 2022-08-08: 75 mL via INTRAVENOUS

## 2022-08-08 MED ORDER — PREDNISONE 20 MG PO TABS: 40.0000 mg | ORAL_TABLET | Freq: Every day | ORAL | 0 refills | Status: AC

## 2022-08-08 MED ORDER — BENZONATATE 100 MG PO CAPS: 100.0000 mg | ORAL_CAPSULE | Freq: Three times a day (TID) | ORAL | 0 refills | Status: AC | PRN

## 2022-08-08 MED ORDER — IPRATROPIUM-ALBUTEROL 0.5-2.5 (3) MG/3ML IN SOLN
3.0000 mL | Freq: Once | RESPIRATORY_TRACT | Status: AC
  Administered 2022-08-08: 3 mL via RESPIRATORY_TRACT
  Filled 2022-08-08: qty 3

## 2022-08-08 MED ORDER — AZITHROMYCIN 500 MG PO TABS: 500.0000 mg | ORAL_TABLET | Freq: Every day | ORAL | 0 refills | Status: AC

## 2022-08-08 MED ORDER — SODIUM CHLORIDE 0.9 % IV BOLUS
250.0000 mL | Freq: Once | INTRAVENOUS | Status: AC
  Administered 2022-08-08: 250 mL via INTRAVENOUS

## 2022-08-08 MED ORDER — ALBUTEROL SULFATE (2.5 MG/3ML) 0.083% IN NEBU: 2.5000 mg | INHALATION_SOLUTION | RESPIRATORY_TRACT | 0 refills | Status: AC | PRN

## 2022-08-08 MED ORDER — PREDNISONE 20 MG PO TABS
40.0000 mg | ORAL_TABLET | Freq: Once | ORAL | Status: AC
  Administered 2022-08-08: 40 mg via ORAL
  Filled 2022-08-08: qty 2

## 2022-08-08 MED ORDER — AMOXICILLIN-POT CLAVULANATE 875-125 MG PO TABS: 1.0000 | ORAL_TABLET | Freq: Two times a day (BID) | ORAL | 0 refills | Status: AC

## 2022-08-08 NOTE — ED Notes (Signed)
Hob adjusted for pt, pt has pillow and warm blankets, all personal items within reach, stretcher rails up and stretcher locked low, call bell within reach, pt received ice chips requested with verbal approval from EDP Ray, N.

## 2022-08-08 NOTE — ED Notes (Signed)
Pt's fluid bag has been running slow due to not keeping L arm completely straight. Nearly done.

## 2022-08-08 NOTE — ED Provider Notes (Signed)
[facility] Provider Note    Event Date/Time   First MD Initiated Contact with Patient 08/08/22 0900     (approximate)   History   Cough, Chills, and Diarrhea   HPI  Stacie Shah is a 52 y.o. female with history of CHF and asthma presenting to the ER for evaluation of cough.  Patient was recently around her god children who were sick and cough in her face.  For the past week she has had cough with associated shortness of breath.  Reports subjective fevers at home.  Has a history of CHF, but does not feel fluid overloaded, actually think she may be dehydrated.  No chest pain.  Does additionally have a history of asthma and feels like she needs a breathing treatment.  Additionally reports some nonbloody diarrhea.     Physical Exam   Triage Vital Signs: ED Triage Vitals  Enc Vitals Group     BP 08/08/22 0850 136/84     Pulse Rate 08/08/22 0850 98     Resp 08/08/22 0850 (!) 24     Temp 08/08/22 0850 99.2 F (37.3 C)     Temp Source 08/08/22 0850 Oral     SpO2 08/08/22 0850 91 %     Weight 08/08/22 0851 (!) 379 lb (171.9 kg)     Height 08/08/22 0851 5\' 5"  (1.651 m)     Head Circumference --      Peak Flow --      Pain Score 08/08/22 0851 9     Pain Loc --      Pain Edu? --      Excl. in GC? --     Most recent vital signs: Vitals:   08/08/22 1245 08/08/22 1300  BP:  125/76  Pulse:  93  Resp: 17 (!) 22  Temp:    SpO2:  98%     General: Awake, interactive  CV:  Regular rate, good peripheral perfusion.  Resp:  Scattered expiratory wheezing with mildly labored respirations, no stridor Abd:  Soft, nondistended.  Neuro:  Symmetric facial movement, fluid speech   ED Results / Procedures / Treatments   Labs (all labs ordered are listed, but only abnormal results are displayed) Labs Reviewed  COMPREHENSIVE METABOLIC PANEL - Abnormal; Notable for the following components:      Result Value   Sodium 134 (*)    Glucose, Bld 114 (*)     Calcium 8.3 (*)    Albumin 3.1 (*)    Total Bilirubin 1.7 (*)    All other components within normal limits  CBC WITH DIFFERENTIAL/PLATELET - Abnormal; Notable for the following components:   RBC 3.85 (*)    All other components within normal limits  D-DIMER, QUANTITATIVE - Abnormal; Notable for the following components:   D-Dimer, Quant 0.77 (*)    All other components within normal limits  SARS CORONAVIRUS 2 BY RT PCR  BRAIN NATRIURETIC PEPTIDE     EKG EKG independently reviewed interpreted by myself (ER attending) demonstrates:  EKG demonstrates sinus rhythm at a rate of 99, PR 161, QRS 80, QTc 478, no acute ST changes  RADIOLOGY Imaging independently reviewed and interpreted by myself demonstrates:  CXR with scattered consolidations, no overt pulmonary edema CTA of the chest without PE, does demonstrate multifocal airspace opacities  PROCEDURES:  Critical Care performed: No  Procedures   MEDICATIONS ORDERED IN ED: Medications  ipratropium-albuterol (DUONEB) 0.5-2.5 (3) MG/3ML nebulizer solution 3 mL (3 mLs Nebulization Given  08/08/22 0934)  iohexol (OMNIPAQUE) 350 MG/ML injection 75 mL (75 mLs Intravenous Contrast Given 08/08/22 1032)  sodium chloride 0.9 % bolus 250 mL (0 mLs Intravenous Stopped 08/08/22 1307)  predniSONE (DELTASONE) tablet 40 mg (40 mg Oral Given 08/08/22 1208)  ipratropium-albuterol (DUONEB) 0.5-2.5 (3) MG/3ML nebulizer solution 3 mL (3 mLs Nebulization Given 08/08/22 1209)     IMPRESSION / MDM / ASSESSMENT AND PLAN / ED COURSE  I reviewed the triage vital signs and the nursing notes.  Differential diagnosis includes, but is not limited to, viral illness, pneumonia, PE, CHF exacerbation, asthma exacerbation  Patient's presentation is most consistent with acute presentation with potential threat to life or bodily function.  52 year old female presenting to the emergency department for evaluation of cough for 1 week.  She does have a history of asthma  and CHF, but does not appear overtly fluid overloaded on exam here.  She does have some mild expiratory wheezing improved following a DuoNeb treatment.  Her lab work is overall reassuring.  Her BNP is normal.  Her D-dimer was elevated, so a CT of the chest was obtained without evidence of PE, though this does demonstrate opacities concerning for infection.  Suspect patient likely has an acute respiratory infection possibly with superimposed pneumonia leading to a mild asthma flare.  O2 sats have been normal here and patient does not have significantly labored respiration.  Patient has had decreased p.o. intake and does not seem to be fluid overloaded.  Will give her a small fluid bolus here of 250 cc.  Did discuss discharge with outpatient follow-up and patient is comfortable with this plan.  She is requesting a new nebulizer machine which I do think is reasonable with her history of asthma.  Will also DC with short steroid course, antibiotics, and will trial Tessalon Perles for her cough.   Clinical Course as of 08/08/22 1535  Mon Aug 08, 2022  1016 D-dimer, quantitative(!) D dimer elevated, will order CTA to further evaluate. [NR]    Clinical Course User Index [NR] Trinna Post, MD     FINAL CLINICAL IMPRESSION(S) / ED DIAGNOSES   Final diagnoses:  Community acquired pneumonia, unspecified laterality  Acute cough  Asthma with acute exacerbation, unspecified asthma severity, unspecified whether persistent     Rx / DC Orders   ED Discharge Orders          Ordered    For home use only DME Nebulizer machine  Status:  Canceled        08/08/22 1201    predniSONE (DELTASONE) 20 MG tablet  Daily with breakfast        08/08/22 1201    amoxicillin-clavulanate (AUGMENTIN) 875-125 MG tablet  2 times daily        08/08/22 1201    azithromycin (ZITHROMAX) 500 MG tablet  Daily        08/08/22 1201    albuterol (PROVENTIL) (2.5 MG/3ML) 0.083% nebulizer solution  Every 4 hours PRN        08/08/22  1201    benzonatate (TESSALON PERLES) 100 MG capsule  3 times daily PRN        08/08/22 1243    For home use only DME Nebulizer machine        08/08/22 1247             Note:  This document was prepared using Dragon voice recognition software and may include unintentional dictation errors.   Trinna Post, MD 08/08/22 1535

## 2022-08-08 NOTE — Discharge Instructions (Addendum)
You are seen in the emergency department today for evaluation of your cough.  I suspect that you likely have a respiratory infection causing your symptoms.  Your CT scan was concerning for possible pneumonia.  I sent prescription for 2 antibiotics to your pharmacy.  I do think your infection is causing an asthma flare.  I sent a prescription for a steroid to your pharmacy to help with this.  I also sent a nebulizer prescription.  I have sent a cough medicine that you can take as needed.  Call your primary care doctor within a few days for reevaluation.  Return to the ER for any new or worsening symptoms.

## 2022-08-08 NOTE — ED Notes (Signed)
See triage note, pt's RR currently 23, 96% RA; central pressure in chest when pt coughs per pt.

## 2022-08-08 NOTE — ED Notes (Signed)
Pt denies any needs currently.  

## 2022-08-08 NOTE — ED Triage Notes (Signed)
Pt arrived via POV with reports of cough, chills, congestion for over the week, pt states she feels like something is sitting on her chest when she coughs.   Pt has hx of CHF and asthma.

## 2022-08-08 NOTE — ED Notes (Signed)
Pt leaving for imaging.

## 2022-08-08 NOTE — ED Notes (Signed)
Breathing tx at bedside. Pt leaving for imaging. Will start tx once pt back to room.

## 2022-10-23 NOTE — Progress Notes (Unsigned)
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 "AVOCADO", "BANANA", "BARLEY", "BASIL", "BAYLEAF", "GREENBEAN", "LIMABEAN", "WHITEBEAN", "BEEFIGE", "REDBEET", "BLUEBERRY", "BROCCOLI", "CABBAGE", "MELON", "CARROT", "CASEIN", "CASHEWNUT", "CAULIFLOWER", "CELERY"     Note: Lab results reviewed.  PFSH  Drug: [name]  reports no history of drug use. Alcohol:  reports current alcohol use. Tobacco:  reports that she has never smoked. She has never used smokeless tobacco. Medical:  has a past medical history of Anxiety, Arrhythmia, Arthritis, Asthma, CHF (congestive heart failure) (HCC), Depression, Diabetes mellitus without complication (HCC), GERD (gastroesophageal reflux disease), Hypercholesteremia,  Hypertension, Seizure (HCC), and Sleep apnea. Family: family history is not on file.  Past Surgical History:  Procedure Laterality Date   ABDOMINAL SURGERY     Gastric Sleeve   CATARACT EXTRACTION W/PHACO Right 05/24/2019   Procedure: CATARACT EXTRACTION PHACO AND INTRAOCULAR LENS PLACEMENT (IOC) RIGHT DIABETIC;  Surgeon: Galen Manila, MD;  Location: [facility] ORS;  Service: Ophthalmology;  Laterality: Right;  [place] 00:23.6 CDE 1.88 Fluid Pack Lot # [id number]   CHOLECYSTECTOMY     FOOT SURGERY     cyst removal from heel   REPEAT CESAREAN SECTION     c-section x3   TYMPANOSTOMY     Active Ambulatory Problems    Diagnosis Date Noted   CHF (congestive heart failure) (HCC) 07/25/2021   HTN (hypertension) 07/25/2021   HLD (hyperlipidemia) 07/25/2021   GERD (gastroesophageal reflux disease) 07/25/2021   Seizure (HCC) 07/25/2021   OSA (obstructive sleep apnea) 07/25/2021   Chronic atrial fibrillation with RVR (HCC) 07/25/2021   Adjustment reaction with anxiety and depression 02/13/2017   Abnormally low high density lipoprotein (HDL) cholesterol with hypertriglyceridemia 02/16/2017   Anxiety 03/20/2014   Benign essential HTN 06/28/2016   Morbid obesity with BMI of 60.0-69.9, adult (HCC) 06/29/2016   Chronic diastolic CHF (congestive heart failure), NYHA class 2 (HCC) 06/28/2016   De Quervain's tenosynovitis 05/23/2022   Depressive disorder 02/20/2006   Left shoulder pain 09/15/2021   Metabolic syndrome 02/16/2017   Altered awareness, transient 04/22/2019   BMI 60.0-69.9, adult (HCC) 02/16/2017   Atrial fibrillation (HCC) 10/01/2021   Prediabetes 10/25/2021   Chronic pain 09/01/2009   Type 2 diabetes mellitus without complication (HCC) 03/15/2018   Resolved Ambulatory Problems    Diagnosis Date Noted   No Resolved Ambulatory Problems   Past Medical History:  Diagnosis Date   Arrhythmia    Arthritis    Asthma    Depression    Diabetes mellitus without complication (HCC)     Hypercholesteremia    Hypertension    Sleep apnea    Constitutional Exam  General appearance: Well nourished, well developed, and well hydrated. In no apparent acute distress There were no vitals filed for this visit. BMI Assessment: Estimated body mass index is 63.07 kg/m as calculated from the following:   Height as of 08/08/22: 5\' 5"  (1.651 m).   Weight as of 08/08/22: 379 lb (171.9 kg).  BMI interpretation table: BMI level Category Range association with higher incidence of chronic pain  <18 kg/m2 Underweight   18.5-24.9 kg/m2 Ideal body weight   25-29.9 kg/m2 Overweight Increased incidence by 20%  30-34.9 kg/m2 Obese (Class I) Increased incidence by 68%  35-39.9 kg/m2 Severe obesity (Class II) Increased incidence by 136%  >40 kg/m2 Extreme obesity (Class III) Increased incidence by 254%   Patient's current BMI Ideal Body weight  There is no height or weight on file to calculate BMI. Patient  weight not recorded   BMI Readings from Last 4 Encounters:  08/08/22 63.07 kg/m  06/20/22 63.90 kg/m  10/11/21 66.79 kg/m  09/05/21 64.90 kg/m   Wt Readings from Last 4 Encounters:  08/08/22 (!) 379 lb (171.9 kg)  06/20/22 (!) 384 lb (174.2 kg)  10/11/21 (!) 401 lb 6 oz (182.1 kg)  09/05/21 (!) 390 lb (176.9 kg)    Psych/Mental status: Alert, oriented x 3 (person, place, & time)       Eyes: PERLA Respiratory: No evidence of acute respiratory distress  Assessment  Primary Diagnosis & Pertinent Problem List: {There were no encounter diagnoses. (Refresh or delete this SmartLink)}  Visit Diagnosis (New problems to examiner): No diagnosis found. Plan of Care (Initial workup plan)  Note: [name] was reminded that as per protocol, today's visit has been an evaluation only. We have not taken over the patient's controlled substance management.  Problem-specific plan: No problem-specific Assessment & Plan notes found for this encounter.  Lab Orders  No laboratory test(s)  ordered today   Imaging Orders  No imaging studies ordered today   Referral Orders  No referral(s) requested today   Procedure Orders    No procedure(s) ordered today   Pharmacotherapy (current): Medications ordered:  No orders of the defined types were placed in this encounter.  Medications administered during this visit: Stacie Shah had no medications administered during this visit.   Analgesic Pharmacotherapy:  Opioid Analgesics: For patients currently taking or requesting to take opioid analgesics, in accordance with [facility] Guidelines, we will assess their risks and indications for the use of these substances. After completing our evaluation, we may offer recommendations, but we no longer take patients for medication management. The prescribing physician will ultimately decide, based on his/her training and level of comfort whether to adopt any of the recommendations, including whether or not to prescribe such medicines.  Membrane stabilizer: To be determined at a later time  Muscle relaxant: To be determined at a later time  NSAID: To be determined at a later time  Other analgesic(s): To be determined at a later time   Interventional management options: [name] was informed that there is no guarantee that she would be a candidate for interventional therapies. The decision will be based on the results of diagnostic studies, as well as [name]'s risk profile.  Procedure(s) under consideration:  Pending results of ordered studies      Interventional Therapies  Risk Factors  Considerations  Medical Comorbidities:     Planned  Pending:      Under consideration:   Pending   Completed:   None at this time   Therapeutic  Palliative (PRN) options:   None established   Completed by other providers:   None reported       Provider-requested follow-up: No follow-ups on file.  Future Appointments  Date Time Provider Department Center   10/24/2022  8:00 AM Delano Metz, MD [facility] None    Duration of encounter: *** minutes.  Total time on encounter, as per AMA guidelines included both the face-to-face and non-face-to-face time personally spent by the physician and/or other qualified health care professional(s) on the day of the encounter (includes time in activities that require the physician or other qualified health care professional and does not include time in activities normally performed by clinical staff). Physician's time may include the following activities when performed: Preparing to see the patient (e.g., pre-charting review of records, searching for previously ordered  imaging, lab work, and nerve conduction tests) Review of prior analgesic pharmacotherapies. Reviewing PMP Interpreting ordered tests (e.g., lab work, imaging, nerve conduction tests) Performing post-procedure evaluations, including interpretation of diagnostic procedures Obtaining and/or reviewing separately obtained history Performing a medically appropriate examination and/or evaluation Counseling and educating the patient/family/caregiver Ordering medications, tests, or procedures Referring and communicating with other health care professionals (when not separately reported) Documenting clinical information in the electronic or other health record Independently interpreting results (not separately reported) and communicating results to the patient/ family/caregiver Care coordination (not separately reported)  Note by: Oswaldo Done, MD (TTS technology used. I apologize for any typographical errors that were not detected and corrected.) Date: 10/24/2022; Time: 8:59 AM

## 2022-10-24 ENCOUNTER — Ambulatory Visit: Payer: Medicaid Other | Attending: Pain Medicine | Admitting: Pain Medicine

## 2022-10-24 ENCOUNTER — Encounter: Payer: Self-pay | Admitting: Pain Medicine

## 2022-10-24 VITALS — BP 140/104 | HR 66 | Temp 96.4°F | Ht 65.0 in | Wt 383.0 lb

## 2022-10-24 DIAGNOSIS — M5481 Occipital neuralgia: Secondary | ICD-10-CM | POA: Insufficient documentation

## 2022-10-24 DIAGNOSIS — M153 Secondary multiple arthritis: Secondary | ICD-10-CM | POA: Diagnosis not present

## 2022-10-24 DIAGNOSIS — I110 Hypertensive heart disease with heart failure: Secondary | ICD-10-CM | POA: Diagnosis not present

## 2022-10-24 DIAGNOSIS — Z8659 Personal history of other mental and behavioral disorders: Secondary | ICD-10-CM | POA: Insufficient documentation

## 2022-10-24 DIAGNOSIS — Z6841 Body Mass Index (BMI) 40.0 and over, adult: Secondary | ICD-10-CM | POA: Diagnosis not present

## 2022-10-24 DIAGNOSIS — Z79899 Other long term (current) drug therapy: Secondary | ICD-10-CM | POA: Diagnosis present

## 2022-10-24 DIAGNOSIS — Z789 Other specified health status: Secondary | ICD-10-CM | POA: Diagnosis not present

## 2022-10-24 DIAGNOSIS — R7989 Other specified abnormal findings of blood chemistry: Secondary | ICD-10-CM | POA: Diagnosis not present

## 2022-10-24 DIAGNOSIS — E559 Vitamin D deficiency, unspecified: Secondary | ICD-10-CM | POA: Insufficient documentation

## 2022-10-24 DIAGNOSIS — I5032 Chronic diastolic (congestive) heart failure: Secondary | ICD-10-CM | POA: Diagnosis not present

## 2022-10-24 DIAGNOSIS — H9202 Otalgia, left ear: Secondary | ICD-10-CM | POA: Diagnosis not present

## 2022-10-24 DIAGNOSIS — M25512 Pain in left shoulder: Secondary | ICD-10-CM | POA: Diagnosis not present

## 2022-10-24 DIAGNOSIS — M899 Disorder of bone, unspecified: Secondary | ICD-10-CM | POA: Insufficient documentation

## 2022-10-24 DIAGNOSIS — M542 Cervicalgia: Secondary | ICD-10-CM | POA: Insufficient documentation

## 2022-10-24 DIAGNOSIS — R102 Pelvic and perineal pain: Secondary | ICD-10-CM | POA: Insufficient documentation

## 2022-10-24 DIAGNOSIS — G4486 Cervicogenic headache: Secondary | ICD-10-CM | POA: Diagnosis not present

## 2022-10-24 DIAGNOSIS — G8929 Other chronic pain: Secondary | ICD-10-CM

## 2022-10-24 DIAGNOSIS — G894 Chronic pain syndrome: Secondary | ICD-10-CM | POA: Diagnosis present

## 2022-10-24 NOTE — Patient Instructions (Signed)
____________________________________________________________________________________________  New Patients  Welcome to [facility].   Initial Visit The first or initial visit consists of an evaluation only.   Interventional pain management.  We offer therapies other than opioid controlled substances to manage chronic pain. These include, but are not limited to, diagnostic, therapeutic, and palliative specialized injection therapies (i.e.: Epidural Steroids, Facet Blocks, etc.). We specialize in a variety of nerve blocks as well as radiofrequency treatments. We offer pain implant evaluations and trials, as well as follow up management. In addition we also provide a variety joint injections, including Viscosupplementation (AKA: Gel Therapy).  Prescription Pain Medication. We specialize in alternatives to opioids. We can provide evaluations and recommendations for/of pharmacologic therapies based on CDC Guidelines.  We no longer take patients for long-term medication management. We will not be taking over your pain medications.  ____________________________________________________________________________________________    ____________________________________________________________________________________________  Patient Information update  To: All of our patients.  Re: Name change.  It has been made official that our current name, "[facility]"   will soon be changed to "[facility]".   The purpose of this change is to eliminate any confusion created by the concept of our practice being a "Medication Management Pain Clinic". In the past this has led to the misconception that we treat pain primarily by the use of prescription medications.  Nothing can be farther from the truth.   Understanding PAIN MANAGEMENT: To  further understand what our practice does, you first have to understand that "Pain Management" is a subspecialty that requires additional training once a physician has completed their specialty training, which can be in either Anesthesia, Neurology, Psychiatry, or Physical Medicine and Rehabilitation (PMR). Each one of these contributes to the final approach taken by each physician to the management of their patient's pain. To be a "Pain Management Specialist" you must have first completed one of the specialty trainings below.  Anesthesiologists - trained in clinical pharmacology and interventional techniques such as nerve blockade and regional as well as central neuroanatomy. They are trained to block pain before, during, and after surgical interventions.  Neurologists - trained in the diagnosis and pharmacological treatment of complex neurological conditions, such as Multiple Sclerosis, Parkinson's, spinal cord injuries, and other systemic conditions that may be associated with symptoms that may include but are not limited to pain. They tend to rely primarily on the treatment of chronic pain using prescription medications.  Psychiatrist - trained in conditions affecting the psychosocial wellbeing of patients including but not limited to depression, anxiety, schizophrenia, personality disorders, addiction, and other substance use disorders that may be associated with chronic pain. They tend to rely primarily on the treatment of chronic pain using prescription medications.   Physical Medicine and Rehabilitation (PMR) physicians, also known as physiatrists - trained to treat a wide variety of medical conditions affecting the brain, spinal cord, nerves, bones, joints, ligaments, muscles, and tendons. Their training is primarily aimed at treating patients that have suffered injuries that have caused severe physical impairment. Their training is primarily aimed at the physical therapy and rehabilitation of those  patients. They may also work alongside orthopedic surgeons or neurosurgeons using their expertise in assisting surgical patients to recover after their surgeries.  INTERVENTIONAL PAIN MANAGEMENT is sub-subspecialty of Pain Management.  Our physicians are Board-certified in Anesthesia, Pain Management, and Interventional Pain Management.  This meaning that not only have they been trained  and Board-certified in their specialty of Anesthesia, and subspecialty of Pain Management, but they have also received further training in the sub-subspecialty of Interventional Pain Management, in order to become Board-certified as INTERVENTIONAL PAIN MANAGEMENT SPECIALIST.    Mission: Our goal is to use our skills in  INTERVENTIONAL PAIN MANAGEMENT as alternatives to the chronic use of prescription opioid medications for the treatment of pain. To make this more clear, we have changed our name to reflect what we do and offer. We will continue to offer medication management assessment and recommendations, but we will not be taking over any patient's medication management.  ____________________________________________________________________________________________     _________________________________________________________________________________________  Body mass index (BMI) Weight Management Required  URGENT: Dear Stacie Shah your weight has been found to be adversely affecting your health. Aggressive action is immediately required. Talk to your primary care physician.  Body mass index (BMI) is a common tool for deciding whether a person has an appropriate body weight.  It measures a persons weight in relation to their height.   According to the [facility] of health (NIH): A BMI of less than 18.5 means that a person is underweight. A BMI of between 18.5 and 24.9 is ideal. A BMI of between 25 and 29.9 is overweight. A BMI over 30 indicates obesity.  Body Mass Index (BMI) Classification BMI level  (kg/m2) Category Associated incidence of chronic pain  <18  Underweight   18.5-24.9 Ideal body weight   25-29.9 Overweight  20%  30-34.9 Obese (Class I)  68%  35-39.9 Severe obesity (Class II)  136%  >40 Extreme obesity (Class III)  254%   Your current Estimated body mass index is 63.07 kg/m as calculated from the following:   Height as of 08/08/22: 5\' 5"  (1.651 m).   Weight as of 08/08/22: 379 lb (171.9 kg).  Morbidly Obese Classification: You will be considered to be "Morbidly Obese" if your BMI is above 30 and you have one or more of the following conditions caused or associated to obesity: 1.    Type 2 Diabetes (Leading to cardiovascular diseases (CVD), stroke, peripheral vascular diseases (PVD), retinopathy, nephropathy, and neuropathy) 2.    Cardiovascular Disease (High Blood Pressure; Congestive Heart Failure; High Cholesterol; Coronary Artery Disease; Angina; Arrhythmias, Dysrhythmias, or Heart Attacks) 3.    Breathing problems (Asthma; obesity-hypoventilation syndrome; obstructive sleep apnea; chronic inflammatory airway disease; reactive airway disease; or shortness of breath) 4.    Chronic kidney disease 5.    Liver disease (nonalcoholic fatty liver disease) 6.    High blood pressure 7.    Acid reflux (gastroesophageal reflux disease; heartburn) 8.    Osteoarthritis (OA) (affecting the hip(s), the knee(s) and/or the lower back) 9.    Low back pain (Lumbar Facet Syndrome; and/or Degenerative Disc Disease) 10.  Hip pain (Osteoarthritis of hip) (For every 1 lbs of added body weight, there is a 2 lbs increase in pressure inside of each hip articulation. 1:2 mechanical relationship) 11.  Knee pain (Osteoarthritis of knee) (For every 1 lbs of added body weight, there is a 4 lbs increase in pressure inside of each knee articulation. 1:4 mechanical relationship) (patients with a BMI>30 kg/m2 were 6.8 times more likely to develop knee OA than normal-weight individuals) 12.  Cancer:  Epidemiological studies have shown that obesity is a risk factor for: post-menopausal breast cancer; cancers of the endometrium, colon and kidney cancer; malignant adenomas of the oesophagus. Obese subjects have an approximately 1.5-3.5-fold increased risk of developing these cancers compared  with normal-weight subjects, and it has been estimated that between 15 and 45% of these cancers can be attributed to overweight. More recent studies suggest that obesity may also increase the risk of other types of cancer, including pancreatic, hepatic and gallbladder cancer. (Ref: Obesity and cancer. Pischon T, Nthlings U, [employer] H. Proc Nutr Soc. 2008 May;67(2):128-45. doi: 10.1017/S0029665108006976.) The International Agency for Research on Cancer (IARC) has identified 13 cancers associated with overweight and obesity: meningioma, multiple myeloma, adenocarcinoma of the esophagus, and cancers of the thyroid, postmenopausal breast cancer, gallbladder, stomach, liver, pancreas, kidney, ovaries, uterus, colon and rectal (colorectal) cancers. 55 percent of all cancers diagnosed in women and 24 percent of those diagnosed in men are associated with overweight and obesity.  Recommendation: At this point it is urgent that you take a step back and concentrate in loosing weight. Dedicate 100% of your efforts on this task. Nothing else will improve your health more than bringing your weight down and your BMI to less than 30. If you are here, you probably have chronic pain. We know that most chronic pain patients have difficulty exercising secondary to their pain. For this reason, you must rely on proper nutrition and diet in order to lose the weight. If your BMI is above 40, you should seriously consider bariatric surgery. A realistic goal is to lose 10% of your body weight over a period of 12 months.  Be honest to yourself, if over time you have unsuccessfully tried to lose weight, then it is time for you to seek professional help  and to enter a medically supervised weight management program, and/or undergo bariatric surgery. Stop procrastinating.   Pain management considerations:  1.    Pharmacological Problems: Be advised that the use of opioid analgesics (oxycodone; hydrocodone; morphine; methadone; codeine; and all of their derivatives) have been associated with decreased metabolism and weight gain.  For this reason, should we see that you are unable to lose weight while taking these medications, it may become necessary for [place] to taper down and indefinitely discontinue them.  2.    Technical Problems: The incidence of successful interventional therapies decreases as the patient's BMI increases. It is much more difficult to accomplish a safe and effective interventional therapy on a patient with a BMI above 35. 3.    Radiation Exposure Problems: The x-rays machine, used to accomplish injection therapies, will automatically increase their x-ray output in order to capture an appropriate bone image. This means that radiation exposure increases exponentially with the patient's BMI. (The higher the BMI, the higher the radiation exposure.) Although the level of radiation used at a given time is still safe to the patient, it is not for the physician and/or assisting staff. Unfortunately, radiation exposure is accumulative. Because physicians and the staff have to do procedures and be exposed on a daily basis, this can result in health problems such as cancer and radiation burns. Radiation exposure to the staff is monitored by the radiation batches that they wear. The exposure levels are reported back to the staff on a quarterly basis. Depending on levels of exposure, physicians and staff may be obligated by law to decrease this exposure. This means that they have the right and obligation to refuse providing therapies where they may be overexposed to radiation. For this reason, physicians may decline to offer therapies such as radiofrequency  ablation or implants to patients with a BMI above 40. 4.    Current Trends: Be advised that the current trend is to  no longer offer certain therapies to patients with a BMI equal to, or above 35, due to increase perioperative risks, increased technical procedural difficulties, and excessive radiation exposure to healthcare personnel.  _________________________________________________________________________________________

## 2022-10-24 NOTE — Progress Notes (Signed)
Safety precautions to be maintained throughout the outpatient stay will include: orient to surroundings, keep bed in low position, maintain call bell within reach at all times, provide assistance with transfer out of bed and ambulation.  

## 2022-10-25 ENCOUNTER — Ambulatory Visit
Admission: RE | Admit: 2022-10-25 | Discharge: 2022-10-25 | Disposition: A | Discharge status: Home / Self Care | Payer: Medicaid Other | Attending: Pain Medicine | Admitting: Pain Medicine

## 2022-10-25 ENCOUNTER — Other Ambulatory Visit
Admission: RE | Admit: 2022-10-25 | Discharge: 2022-10-25 | Disposition: A | Discharge status: Home / Self Care | Payer: Medicaid Other | Source: Home / Self Care | Attending: Pain Medicine | Admitting: Pain Medicine

## 2022-10-25 ENCOUNTER — Ambulatory Visit
Admission: RE | Admit: 2022-10-25 | Discharge: 2022-10-25 | Disposition: A | Discharge status: Home / Self Care | Payer: Medicaid Other | Source: Ambulatory Visit | Attending: Pain Medicine | Admitting: Pain Medicine

## 2022-10-25 DIAGNOSIS — M542 Cervicalgia: Secondary | ICD-10-CM

## 2022-10-25 DIAGNOSIS — G8929 Other chronic pain: Secondary | ICD-10-CM | POA: Diagnosis present

## 2022-10-25 LAB — C-REACTIVE PROTEIN: CRP: 0.5 mg/dL (ref <1.0)

## 2022-10-25 LAB — VITAMIN B12: Vitamin B-12: 604 pg/mL (ref 180–914)

## 2022-10-25 LAB — SEDIMENTATION RATE: Sed Rate: 14 mm/hr (ref 0–30)

## 2022-11-01 ENCOUNTER — Ambulatory Visit: Admission: RE | Admit: 2022-11-01 | Payer: Medicaid Other | Source: Ambulatory Visit

## 2022-11-04 ENCOUNTER — Ambulatory Visit
Admission: RE | Admit: 2022-11-04 | Discharge: 2022-11-04 | Disposition: A | Discharge status: Home / Self Care | Payer: Medicaid Other | Source: Ambulatory Visit | Attending: Pain Medicine | Admitting: Pain Medicine

## 2022-11-04 DIAGNOSIS — G8929 Other chronic pain: Secondary | ICD-10-CM | POA: Insufficient documentation

## 2022-11-04 DIAGNOSIS — M542 Cervicalgia: Secondary | ICD-10-CM | POA: Insufficient documentation

## 2022-11-04 DIAGNOSIS — Z8659 Personal history of other mental and behavioral disorders: Secondary | ICD-10-CM | POA: Insufficient documentation

## 2022-11-10 DIAGNOSIS — Z8679 Personal history of other diseases of the circulatory system: Secondary | ICD-10-CM | POA: Insufficient documentation

## 2022-11-10 NOTE — Progress Notes (Unsigned)
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 Plan  Primary Diagnosis & Pertinent Problem List: The primary encounter diagnosis was Chronic neck pain (1ry area  of Pain) (Midline) (Left). Diagnoses of Cervicogenic headache (2ry area of Pain) (Left), Chronic shoulder pain (3ry area of Pain) (Left), Cervico-occipital neuralgia (Left), Subacromial bursitis of shoulder joint (Left), Tendinosis of rotator cuff (Left), Traumatic tear of left rotator cuff, unspecified tear extent, sequela, BMI 60.0-69.9, adult (HCC), Chronic diastolic CHF (congestive heart failure), NYHA class 2 (HCC), Gastroesophageal reflux disease, unspecified whether esophagitis present, Hypertension, unspecified type, Metabolic syndrome, Morbid obesity with BMI of 60.0-69.9, adult (HCC), and OSA  (obstructive sleep apnea) were also pertinent to this visit.  Visit Diagnosis: 1. Chronic neck pain (1ry area of Pain) (Midline) (Left)   2. Cervicogenic headache (2ry area of Pain) (Left)   3. Chronic shoulder pain (3ry area of Pain) (Left)   4. Cervico-occipital neuralgia (Left)   5. Subacromial bursitis of shoulder joint (Left)   6. Tendinosis of rotator cuff (Left)   7. Traumatic tear of left rotator cuff, unspecified tear extent, sequela   8. BMI 60.0-69.9, adult (HCC)   9. Chronic diastolic CHF (congestive heart failure), NYHA class 2 (HCC)   10. Gastroesophageal reflux disease, unspecified whether esophagitis present   11. Hypertension, unspecified type   12. Metabolic syndrome   13. Morbid obesity with BMI of 60.0-69.9, adult (HCC)   14. OSA (obstructive sleep apnea)    Problems updated and reviewed during this visit: No problems updated.   Plan of Care  Pharmacotherapy (Medications Ordered): No orders of the defined types were placed in this encounter.  Procedure Orders    No procedure(s) ordered today   Lab Orders  No laboratory test(s) ordered today   Imaging Orders  No imaging studies ordered today   Referral Orders  No referral(s) requested today    Pharmacological management:  Opioid Analgesics: I will not be prescribing any opioids at this time Membrane stabilizer: I will not be prescribing any at this time Muscle relaxant: I will not be prescribing any at this time NSAID: I will not be prescribing any at this time Other analgesic(s): I will not be prescribing any at this time      Interventional Therapies  Risk Factors  Considerations  Medical Comorbidities:  MO (BMI>60) (379 Lbs)  OSA  HTN  D-CHF  A-fib w/ RVR  Hx. Seizures  Hx. Sleeve gastrectomy (2020)     Planned  Pending:      Under consideration:      Completed:   None at this time   Therapeutic  Palliative (PRN) options:   None established   Completed by other providers:    None reported       Provider-requested follow-up: No follow-ups on file. Recent Visits Date Type Provider Dept  10/24/22 Office Visit Delano Metz, MD [facility]  Showing recent visits within past 90 days and meeting all other requirements Future Appointments Date Type Provider Dept  11/16/22 Appointment Delano Metz, MD [facility]  Showing future appointments within next 90 days and meeting all other requirements   Primary Care Physician: [facility]  Duration of encounter: *** minutes.  Total time on encounter, as per AMA guidelines included both the face-to-face and non-face-to-face time personally spent by the physician and/or other qualified health care professional(s) on the day of the encounter (includes time in activities that require the physician or other qualified health care professional and does not include time in activities normally performed by clinical staff). Physician's time may include the following activities when performed: Preparing to see the patient (e.g., pre-charting review of  records, searching for previously ordered imaging, lab work, and nerve conduction tests) Review of prior analgesic pharmacotherapies. Reviewing PMP Interpreting ordered tests (e.g., lab work, imaging, nerve conduction tests) Performing post-procedure evaluations, including interpretation of diagnostic procedures Obtaining and/or reviewing separately obtained history Performing a medically appropriate examination and/or evaluation Counseling and educating the patient/family/caregiver Ordering medications, tests, or procedures Referring and communicating with other health care professionals (when not separately reported) Documenting clinical information in the electronic or other health record Independently interpreting results (not separately reported) and communicating results to the patient/ family/caregiver Care coordination (not  separately reported)  Note by: Oswaldo Done, MD (TTS technology used. I apologize for any typographical errors that were not detected and corrected.) Date: 11/16/2022; Time: 2:50 PM

## 2022-11-13 NOTE — Patient Instructions (Incomplete)
______________________________________________________________________    Procedure instructions  Stop blood-thinners  Do not eat or drink fluids (other than water) for 6 hours before your procedure  No water for 2 hours before your procedure  Take your blood pressure medicine with a sip of water  Arrive 30 minutes before your appointment  If sedation is planned, bring suitable driver. Stacie Shah, Stacie Shah, & public transportation are NOT APPROVED)  Carefully read the "Preparing for your procedure" detailed instructions  If you have questions call us at [phone]  ______________________________________________________________________      ______________________________________________________________________    Preparing for your procedure  Appointments: If you think you may not be able to keep your appointment, call 24-48 hours in advance to cancel. We need time to make it available to others.  During your procedure appointment there will be: No Prescription Refills. No disability issues to discussed. No medication changes or discussions.  Instructions: Food intake: Avoid eating anything solid for at least 8 hours prior to your procedure. Clear liquid intake: You may take clear liquids such as water up to 2 hours prior to your procedure. (No carbonated drinks. No soda.) Transportation: Unless otherwise stated by your physician, bring a driver. (Driver cannot be a Market researcher, Pharmacist, community, or any other form of public transportation.) Morning Medicines: Except for blood thinners, take all of your other morning medications with a sip of water. Make sure to take your heart and blood pressure medicines. If your blood pressure's lower number is above 100, the case will be rescheduled. Blood thinners: Make sure to stop your blood thinners as instructed.  If you take a blood thinner, but were not instructed to stop it, call our office [phone] and ask to talk to a nurse. Not stopping a blood  thinner prior to certain procedures could lead to serious complications. Diabetics on insulin: Notify the staff so that you can be scheduled 1st case in the morning. If your diabetes requires high dose insulin, take only  of your normal insulin dose the morning of the procedure and notify the staff that you have done so. Preventing infections: Shower with an antibacterial soap the morning of your procedure.  Build-up your immune system: Take 1000 mg of Vitamin C with every meal (3 times a day) the day prior to your procedure. Antibiotics: Inform the nursing staff if you are taking any antibiotics or if you have any conditions that may require antibiotics prior to procedures. (Example: recent joint implants)   Pregnancy: If you are pregnant make sure to notify the nursing staff. Not doing so may result in injury to the fetus, including death.  Sickness: If you have a cold, fever, or any active infections, call and cancel or reschedule your procedure. Receiving steroids while having an infection may result in complications. Arrival: You must be in the facility at least 30 minutes prior to your scheduled procedure. Tardiness: Your scheduled time is also the cutoff time. If you do not arrive at least 15 minutes prior to your procedure, you will be rescheduled.  Children: Do not bring any children with you. Make arrangements to keep them home. Dress appropriately: There is always a possibility that your clothing may get soiled. Avoid long dresses. Valuables: Do not bring any jewelry or valuables.  Reasons to call and reschedule or cancel your procedure: (Following these recommendations will minimize the risk of a serious complication.) Surgeries: Avoid having procedures within 2 weeks of any surgery. (Avoid for 2 weeks before or after any surgery). Flu Shots:  Avoid having procedures within 2 weeks of a flu shots or . (Avoid for 2 weeks before or after immunizations). Barium: Avoid having a procedure  within 7-10 days after having had a radiological study involving the use of radiological contrast. (Myelograms, Barium swallow or enema study). Heart attacks: Avoid any elective procedures or surgeries for the initial 6 months after a "Myocardial Infarction" (Heart Attack). Blood thinners: It is imperative that you stop these medications before procedures. Let us know if you if you take any blood thinner.  Infection: Avoid procedures during or within two weeks of an infection (including chest colds or gastrointestinal problems). Symptoms associated with infections include: Localized redness, fever, chills, night sweats or profuse sweating, burning sensation when voiding, cough, congestion, stuffiness, runny nose, sore throat, diarrhea, nausea, vomiting, cold or Flu symptoms, recent or current infections. It is specially important if the infection is over the area that we intend to treat. Heart and lung problems: Symptoms that may suggest an active cardiopulmonary problem include: cough, chest pain, breathing difficulties or shortness of breath, dizziness, ankle swelling, uncontrolled high or unusually low blood pressure, and/or palpitations. If you are experiencing any of these symptoms, cancel your procedure and contact your primary care physician for an evaluation.  Remember:  Regular Business hours are:  Monday to Thursday 8:00 AM to 4:00 PM  Provider's Schedule: Delano Metz, MD:  Procedure days: Tuesday and Thursday 7:30 AM to 4:00 PM  Edward Jolly, MD:  Procedure days: Monday and Wednesday 7:30 AM to 4:00 PM Last  Updated: 11/01/2022 ______________________________________________________________________      ______________________________________________________________________    General Risks and Possible Complications  Patient Responsibilities: It is important that you read this as it is part of your informed consent. It is our duty to inform you of the risks and possible  complications associated with treatments offered to you. It is your responsibility as a patient to read this and to ask questions about anything that is not clear or that you believe was not covered in this document.  Patient's Rights: You have the right to refuse treatment. You also have the right to change your mind, even after initially having agreed to have the treatment done. However, under this last option, if you wait until the last second to change your mind, you may be charged for the materials used up to that point.  Introduction: Medicine is not an Visual merchandiser. Everything in Medicine, including the lack of treatment(s), carries the potential for danger, harm, or loss (which is by definition: Risk). In Medicine, a complication is a secondary problem, condition, or disease that can aggravate an already existing one. All treatments carry the risk of possible complications. The fact that a side effects or complications occurs, does not imply that the treatment was conducted incorrectly. It must be clearly understood that these can happen even when everything is done following the highest safety standards.  No treatment: You can choose not to proceed with the proposed treatment alternative. The "PRO(s)" would include: avoiding the risk of complications associated with the therapy. The "CON(s)" would include: not getting any of the treatment benefits. These benefits fall under one of three categories: diagnostic; therapeutic; and/or palliative. Diagnostic benefits include: getting information which can ultimately lead to improvement of the disease or symptom(s). Therapeutic benefits are those associated with the successful treatment of the disease. Finally, palliative benefits are those related to the decrease of the primary symptoms, without necessarily curing the condition (example: decreasing the pain from a flare-up  of a chronic condition, such as incurable terminal cancer).  General Risks and  Complications: These are associated to most interventional treatments. They can occur alone, or in combination. They fall under one of the following six (6) categories: no benefit or worsening of symptoms; bleeding; infection; nerve damage; allergic reactions; and/or death. No benefits or worsening of symptoms: In Medicine there are no guarantees, only probabilities. No healthcare provider can ever guarantee that a medical treatment will work, they can only state the probability that it may. Furthermore, there is always the possibility that the condition may worsen, either directly, or indirectly, as a consequence of the treatment. Bleeding: This is more common if the patient is taking a blood thinner, either prescription or over the counter (example: Goody Powders, Fish oil, Aspirin, Garlic, etc.), or if suffering a condition associated with impaired coagulation (example: Hemophilia, cirrhosis of the liver, low platelet counts, etc.). However, even if you do not have one on these, it can still happen. If you have any of these conditions, or take one of these drugs, make sure to notify your treating physician. Infection: This is more common in patients with a compromised immune system, either due to disease (example: diabetes, cancer, human immunodeficiency virus [HIV], etc.), or due to medications or treatments (example: therapies used to treat cancer and rheumatological diseases). However, even if you do not have one on these, it can still happen. If you have any of these conditions, or take one of these drugs, make sure to notify your treating physician. Nerve Damage: This is more common when the treatment is an invasive one, but it can also happen with the use of medications, such as those used in the treatment of cancer. The damage can occur to small secondary nerves, or to large primary ones, such as those in the spinal cord and brain. This damage may be temporary or permanent and it may lead to  impairments that can range from temporary numbness to permanent paralysis and/or brain death. Allergic Reactions: Any time a substance or material comes in contact with our body, there is the possibility of an allergic reaction. These can range from a mild skin rash (contact dermatitis) to a severe systemic reaction (anaphylactic reaction), which can result in death. Death: In general, any medical intervention can result in death, most of the time due to an unforeseen complication. ______________________________________________________________________      ______________________________________________________________________    Body mass index (BMI) Weight Management Required  URGENT: Dear Stacie Shah your weight has been found to be adversely affecting your health. Aggressive action is immediately required. Talk to your primary care physician.  Body mass index (BMI) is a common tool for deciding whether a person has an appropriate body weight.  It measures a persons weight in relation to their height.   According to the [facility] of health (NIH): A BMI of less than 18.5 means that a person is underweight. A BMI of between 18.5 and 24.9 is ideal. A BMI of between 25 and 29.9 is overweight. A BMI over 30 indicates obesity.  Body Mass Index (BMI) Classification BMI level (kg/m2) Category Associated incidence of chronic pain  <18  Underweight   18.5-24.9 Ideal body weight   25-29.9 Overweight  20%  30-34.9 Obese (Class I)  68%  35-39.9 Severe obesity (Class II)  136%  >40 Extreme obesity (Class III)  254%   Your current Estimated body mass index is 63.73 kg/m as calculated from the following:   Height as of  10/24/22: 5\' 5"  (1.651 m).   Weight as of 10/24/22: 383 lb (173.7 kg).  Morbidly Obese Classification: You will be considered to be "Morbidly Obese" if your BMI is above 30 and you have one or more of the following conditions caused or associated to obesity: 1.    Type 2  Diabetes (Leading to cardiovascular diseases (CVD), stroke, peripheral vascular diseases (PVD), retinopathy, nephropathy, and neuropathy) 2.    Cardiovascular Disease (High Blood Pressure; Congestive Heart Failure; High Cholesterol; Coronary Artery Disease; Angina; Arrhythmias, Dysrhythmias, or Heart Attacks) 3.    Breathing problems (Asthma; obesity-hypoventilation syndrome; obstructive sleep apnea; chronic inflammatory airway disease; reactive airway disease; or shortness of breath) 4.    Chronic kidney disease 5.    Liver disease (nonalcoholic fatty liver disease) 6.    High blood pressure 7.    Acid reflux (gastroesophageal reflux disease; heartburn) 8.    Osteoarthritis (OA) (affecting the hip(s), the knee(s) and/or the lower back) 9.    Low back pain (Lumbar Facet Syndrome; and/or Degenerative Disc Disease) 10.  Hip pain (Osteoarthritis of hip) (For every 1 lbs of added body weight, there is a 2 lbs increase in pressure inside of each hip articulation. 1:2 mechanical relationship) 11.  Knee pain (Osteoarthritis of knee) (For every 1 lbs of added body weight, there is a 4 lbs increase in pressure inside of each knee articulation. 1:4 mechanical relationship) (patients with a BMI>30 kg/m2 were 6.8 times more likely to develop knee OA than normal-weight individuals) 12.  Cancer: Epidemiological studies have shown that obesity is a risk factor for: post-menopausal breast cancer; cancers of the endometrium, colon and kidney cancer; malignant adenomas of the oesophagus. Obese subjects have an approximately 1.5-3.5-fold increased risk of developing these cancers compared with normal-weight subjects, and it has been estimated that between 15 and 45% of these cancers can be attributed to overweight. More recent studies suggest that obesity may also increase the risk of other types of cancer, including pancreatic, hepatic and gallbladder cancer. (Ref: Obesity and cancer. Pischon T, Nthlings U, [employer] H. Proc  Nutr Soc. 2008 May;67(2):128-45. doi: 10.1017/S0029665108006976.) The International Agency for Research on Cancer (IARC) has identified 13 cancers associated with overweight and obesity: meningioma, multiple myeloma, adenocarcinoma of the esophagus, and cancers of the thyroid, postmenopausal breast cancer, gallbladder, stomach, liver, pancreas, kidney, ovaries, uterus, colon and rectal (colorectal) cancers. 55 percent of all cancers diagnosed in women and 24 percent of those diagnosed in men are associated with overweight and obesity.  Recommendation: At this point it is urgent that you take a step back and concentrate in loosing weight. Dedicate 100% of your efforts on this task. Nothing else will improve your health more than bringing your weight down and your BMI to less than 30. If you are here, you probably have chronic pain. We know that most chronic pain patients have difficulty exercising secondary to their pain. For this reason, you must rely on proper nutrition and diet in order to lose the weight. If your BMI is above 40, you should seriously consider bariatric surgery. A realistic goal is to lose 10% of your body weight over a period of 12 months.  Be honest to yourself, if over time you have unsuccessfully tried to lose weight, then it is time for you to seek professional help and to enter a medically supervised weight management program, and/or undergo bariatric surgery. Stop procrastinating.   Pain management considerations:  1.    Pharmacological Problems: Be advised that the use  of opioid analgesics (oxycodone; hydrocodone; morphine; methadone; codeine; and all of their derivatives) have been associated with decreased metabolism and weight gain.  For this reason, should we see that you are unable to lose weight while taking these medications, it may become necessary for [place] to taper down and indefinitely discontinue them.  2.    Technical Problems: The incidence of successful interventional  therapies decreases as the patient's BMI increases. It is much more difficult to accomplish a safe and effective interventional therapy on a patient with a BMI above 35. 3.    Radiation Exposure Problems: The x-rays machine, used to accomplish injection therapies, will automatically increase their x-ray output in order to capture an appropriate bone image. This means that radiation exposure increases exponentially with the patient's BMI. (The higher the BMI, the higher the radiation exposure.) Although the level of radiation used at a given time is still safe to the patient, it is not for the physician and/or assisting staff. Unfortunately, radiation exposure is accumulative. Because physicians and the staff have to do procedures and be exposed on a daily basis, this can result in health problems such as cancer and radiation burns. Radiation exposure to the staff is monitored by the radiation batches that they wear. The exposure levels are reported back to the staff on a quarterly basis. Depending on levels of exposure, physicians and staff may be obligated by law to decrease this exposure. This means that they have the right and obligation to refuse providing therapies where they may be overexposed to radiation. For this reason, physicians may decline to offer therapies such as radiofrequency ablation or implants to patients with a BMI above 40. 4.    Current Trends: Be advised that the current trend is to no longer offer certain therapies to patients with a BMI equal to, or above 35, due to increase perioperative risks, increased technical procedural difficulties, and excessive radiation exposure to healthcare personnel.  ______________________________________________________________________    For a more information on how diet can influence inflammation, please visit: [email or web address] NOTE: We are not associated or profit from this web site.    ______________________________________________________________________    OTC Supplements: The following over-the-counter (OTC) supplements may be of some benefits when used in moderation in some chronic pain conditions. Note: Always consult with your primary care provider and/or pharmacist before taking any OTC medications to make sure there are no "drug-to-drug" interactions with the medications you currently take. Ask your physician which may be beneficial for your particular condition.  Supplement Possible benefit May be of benefit in treatment of   Turmeric/curcumin* anti-inflammatory Joint and muscle aches and pain associated with arthritis and inflammation  Glucosamine/chondroitin (triple strength)* may slow loss of articular cartilage Osteoarthritis  Vitamin D-3* may suppress release of chemicals associated with inflammation Joint and muscle aches and pain associated with arthritis and inflammation  Moringa* anti-inflammatory with mild analgesic effects Joint and muscle aches and pain associated with arthritis and inflammation  Melatonin* Helps reset sleep cycle. Insomnia. May also be helpful in neurodegenerative disorders  Vitamin B-12* may help keep nerves and blood cells healthy as well as maintaining function of nervous system Neuropathies. Nerve pain (Burning pain)  Alpha-Lipoic-Acid (ALA)* antioxidant that may help with nerve health, pain, and blocking the activation of some inflammatory chemicals Diabetic neuropathy and metabolic syndrome       (*Always use manufacturer's recommended dosage.)  ______________________________________________________________________

## 2022-11-16 ENCOUNTER — Ambulatory Visit: Payer: Medicaid Other | Attending: Pain Medicine | Admitting: Pain Medicine

## 2022-11-16 ENCOUNTER — Encounter: Payer: Self-pay | Admitting: Pain Medicine

## 2022-11-16 VITALS — BP 142/93 | HR 82 | Temp 97.3°F | Ht 65.0 in | Wt 383.0 lb

## 2022-11-16 DIAGNOSIS — Z6841 Body Mass Index (BMI) 40.0 and over, adult: Secondary | ICD-10-CM

## 2022-11-16 DIAGNOSIS — M67814 Other specified disorders of tendon, left shoulder: Secondary | ICD-10-CM | POA: Diagnosis not present

## 2022-11-16 DIAGNOSIS — M4722 Other spondylosis with radiculopathy, cervical region: Secondary | ICD-10-CM | POA: Insufficient documentation

## 2022-11-16 DIAGNOSIS — I110 Hypertensive heart disease with heart failure: Secondary | ICD-10-CM | POA: Diagnosis not present

## 2022-11-16 DIAGNOSIS — G8929 Other chronic pain: Secondary | ICD-10-CM

## 2022-11-16 DIAGNOSIS — M25512 Pain in left shoulder: Secondary | ICD-10-CM | POA: Diagnosis not present

## 2022-11-16 DIAGNOSIS — E119 Type 2 diabetes mellitus without complications: Secondary | ICD-10-CM | POA: Insufficient documentation

## 2022-11-16 DIAGNOSIS — I482 Chronic atrial fibrillation, unspecified: Secondary | ICD-10-CM | POA: Diagnosis not present

## 2022-11-16 DIAGNOSIS — S46012S Strain of muscle(s) and tendon(s) of the rotator cuff of left shoulder, sequela: Secondary | ICD-10-CM

## 2022-11-16 DIAGNOSIS — I10 Essential (primary) hypertension: Secondary | ICD-10-CM | POA: Diagnosis not present

## 2022-11-16 DIAGNOSIS — Z789 Other specified health status: Secondary | ICD-10-CM | POA: Insufficient documentation

## 2022-11-16 DIAGNOSIS — F419 Anxiety disorder, unspecified: Secondary | ICD-10-CM | POA: Diagnosis not present

## 2022-11-16 DIAGNOSIS — K219 Gastro-esophageal reflux disease without esophagitis: Secondary | ICD-10-CM | POA: Diagnosis not present

## 2022-11-16 DIAGNOSIS — E8881 Metabolic syndrome: Secondary | ICD-10-CM | POA: Diagnosis not present

## 2022-11-16 DIAGNOSIS — G894 Chronic pain syndrome: Secondary | ICD-10-CM | POA: Insufficient documentation

## 2022-11-16 DIAGNOSIS — M5481 Occipital neuralgia: Secondary | ICD-10-CM | POA: Diagnosis not present

## 2022-11-16 DIAGNOSIS — Z882 Allergy status to sulfonamides status: Secondary | ICD-10-CM | POA: Insufficient documentation

## 2022-11-16 DIAGNOSIS — I5032 Chronic diastolic (congestive) heart failure: Secondary | ICD-10-CM | POA: Diagnosis not present

## 2022-11-16 DIAGNOSIS — M542 Cervicalgia: Secondary | ICD-10-CM | POA: Diagnosis present

## 2022-11-16 DIAGNOSIS — G4733 Obstructive sleep apnea (adult) (pediatric): Secondary | ICD-10-CM

## 2022-11-16 DIAGNOSIS — E781 Pure hyperglyceridemia: Secondary | ICD-10-CM | POA: Diagnosis not present

## 2022-11-16 DIAGNOSIS — M7731 Calcaneal spur, right foot: Secondary | ICD-10-CM | POA: Insufficient documentation

## 2022-11-16 DIAGNOSIS — Z79899 Other long term (current) drug therapy: Secondary | ICD-10-CM | POA: Insufficient documentation

## 2022-11-16 DIAGNOSIS — O99611 Diseases of the digestive system complicating pregnancy, first trimester: Secondary | ICD-10-CM | POA: Insufficient documentation

## 2022-11-16 DIAGNOSIS — Z98891 History of uterine scar from previous surgery: Secondary | ICD-10-CM | POA: Insufficient documentation

## 2022-11-16 DIAGNOSIS — R569 Unspecified convulsions: Secondary | ICD-10-CM | POA: Insufficient documentation

## 2022-11-16 DIAGNOSIS — G4486 Cervicogenic headache: Secondary | ICD-10-CM | POA: Diagnosis present

## 2022-11-16 DIAGNOSIS — M7732 Calcaneal spur, left foot: Secondary | ICD-10-CM | POA: Insufficient documentation

## 2022-11-16 DIAGNOSIS — F32A Depression, unspecified: Secondary | ICD-10-CM | POA: Insufficient documentation

## 2022-11-16 DIAGNOSIS — M7552 Bursitis of left shoulder: Secondary | ICD-10-CM

## 2022-11-16 DIAGNOSIS — Z7984 Long term (current) use of oral hypoglycemic drugs: Secondary | ICD-10-CM | POA: Insufficient documentation

## 2022-11-16 NOTE — Progress Notes (Signed)
Safety precautions to be maintained throughout the outpatient stay will include: orient to surroundings, keep bed in low position, maintain call bell within reach at all times, provide assistance with transfer out of bed and ambulation.  

## 2022-11-21 ENCOUNTER — Telehealth: Payer: Self-pay

## 2022-11-21 NOTE — Telephone Encounter (Signed)
Insurance Treatment Denial Note  Date order was entered:  Order entered by: Delano Metz, MD Requested treatment: GONB Reason for denial: Treatment not covered by plan. Recommended for approval:  nothing   Her insurance will not approve. They state this is investigational and not medically necessary.

## 2023-01-03 ENCOUNTER — Encounter: Payer: Self-pay | Admitting: Pain Medicine

## 2023-01-03 ENCOUNTER — Ambulatory Visit: Payer: Medicaid Other | Attending: Pain Medicine | Admitting: Pain Medicine

## 2023-01-03 VITALS — BP 147/85 | HR 77 | Temp 97.4°F | Ht 65.0 in | Wt 385.0 lb

## 2023-01-03 DIAGNOSIS — G4486 Cervicogenic headache: Secondary | ICD-10-CM

## 2023-01-03 DIAGNOSIS — R937 Abnormal findings on diagnostic imaging of other parts of musculoskeletal system: Secondary | ICD-10-CM | POA: Diagnosis present

## 2023-01-03 DIAGNOSIS — M542 Cervicalgia: Secondary | ICD-10-CM | POA: Diagnosis present

## 2023-01-03 DIAGNOSIS — G8929 Other chronic pain: Secondary | ICD-10-CM

## 2023-01-03 DIAGNOSIS — M7918 Myalgia, other site: Secondary | ICD-10-CM | POA: Diagnosis present

## 2023-01-03 DIAGNOSIS — M25512 Pain in left shoulder: Secondary | ICD-10-CM
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
## 2023-01-03 NOTE — Progress Notes (Signed)
Safety precautions to be maintained throughout the outpatient stay will include: orient to surroundings, keep bed in low position, maintain call bell within reach at all times, provide assistance with transfer out of bed and ambulation.  

## 2023-01-19 ENCOUNTER — Ambulatory Visit: Payer: Medicaid Other | Attending: Pain Medicine

## 2023-01-26 ENCOUNTER — Ambulatory Visit: Payer: Medicaid Other

## 2023-04-07 NOTE — Progress Notes (Signed)
No show

## 2023-04-10 ENCOUNTER — Ambulatory Visit: Payer: Medicaid Other | Admitting: Internal Medicine

## 2023-05-05 ENCOUNTER — Emergency Department: Payer: Medicaid Other

## 2023-05-05 ENCOUNTER — Observation Stay
Admission: EM | Admit: 2023-05-05 | Discharge: 2023-05-07 | Disposition: A | Discharge status: Home / Self Care | Payer: Medicaid Other | Attending: Internal Medicine | Admitting: Internal Medicine

## 2023-05-05 ENCOUNTER — Encounter: Payer: Self-pay | Admitting: Emergency Medicine

## 2023-05-05 ENCOUNTER — Other Ambulatory Visit: Payer: Self-pay

## 2023-05-05 DIAGNOSIS — E785 Hyperlipidemia, unspecified: Secondary | ICD-10-CM | POA: Diagnosis not present

## 2023-05-05 DIAGNOSIS — N179 Acute kidney failure, unspecified: Principal | ICD-10-CM | POA: Diagnosis present

## 2023-05-05 DIAGNOSIS — G4733 Obstructive sleep apnea (adult) (pediatric): Secondary | ICD-10-CM | POA: Diagnosis not present

## 2023-05-05 DIAGNOSIS — K219 Gastro-esophageal reflux disease without esophagitis: Secondary | ICD-10-CM | POA: Diagnosis present

## 2023-05-05 DIAGNOSIS — R200 Anesthesia of skin: Secondary | ICD-10-CM | POA: Diagnosis not present

## 2023-05-05 DIAGNOSIS — F4323 Adjustment disorder with mixed anxiety and depressed mood: Secondary | ICD-10-CM | POA: Diagnosis present

## 2023-05-05 DIAGNOSIS — F32A Depression, unspecified: Secondary | ICD-10-CM | POA: Diagnosis present

## 2023-05-05 DIAGNOSIS — I10 Essential (primary) hypertension: Secondary | ICD-10-CM | POA: Diagnosis present

## 2023-05-05 DIAGNOSIS — F329 Major depressive disorder, single episode, unspecified: Secondary | ICD-10-CM | POA: Insufficient documentation

## 2023-05-05 DIAGNOSIS — F419 Anxiety disorder, unspecified: Secondary | ICD-10-CM | POA: Diagnosis present

## 2023-05-05 DIAGNOSIS — G894 Chronic pain syndrome: Secondary | ICD-10-CM | POA: Diagnosis present

## 2023-05-05 DIAGNOSIS — Z7984 Long term (current) use of oral hypoglycemic drugs: Secondary | ICD-10-CM | POA: Insufficient documentation

## 2023-05-05 DIAGNOSIS — I509 Heart failure, unspecified: Secondary | ICD-10-CM | POA: Insufficient documentation

## 2023-05-05 DIAGNOSIS — E119 Type 2 diabetes mellitus without complications: Secondary | ICD-10-CM | POA: Insufficient documentation

## 2023-05-05 DIAGNOSIS — E876 Hypokalemia: Secondary | ICD-10-CM | POA: Insufficient documentation

## 2023-05-05 DIAGNOSIS — Z9884 Bariatric surgery status: Secondary | ICD-10-CM

## 2023-05-05 DIAGNOSIS — I4891 Unspecified atrial fibrillation: Secondary | ICD-10-CM | POA: Diagnosis not present

## 2023-05-05 DIAGNOSIS — Z794 Long term (current) use of insulin: Secondary | ICD-10-CM | POA: Diagnosis not present

## 2023-05-05 DIAGNOSIS — Z23 Encounter for immunization: Secondary | ICD-10-CM | POA: Diagnosis not present

## 2023-05-05 DIAGNOSIS — M654 Radial styloid tenosynovitis [de Quervain]: Secondary | ICD-10-CM | POA: Insufficient documentation

## 2023-05-05 DIAGNOSIS — Z79899 Other long term (current) drug therapy: Secondary | ICD-10-CM | POA: Insufficient documentation

## 2023-05-05 DIAGNOSIS — Z6841 Body Mass Index (BMI) 40.0 and over, adult: Secondary | ICD-10-CM | POA: Insufficient documentation

## 2023-05-05 DIAGNOSIS — I110 Hypertensive heart disease with heart failure: Secondary | ICD-10-CM | POA: Diagnosis not present

## 2023-05-05 DIAGNOSIS — E8881 Metabolic syndrome: Secondary | ICD-10-CM | POA: Diagnosis not present

## 2023-05-05 DIAGNOSIS — J45909 Unspecified asthma, uncomplicated: Secondary | ICD-10-CM | POA: Insufficient documentation

## 2023-05-05 DIAGNOSIS — R1110 Vomiting, unspecified: Principal | ICD-10-CM

## 2023-05-05 DIAGNOSIS — Z8679 Personal history of other diseases of the circulatory system: Secondary | ICD-10-CM

## 2023-05-05 DIAGNOSIS — R112 Nausea with vomiting, unspecified: Secondary | ICD-10-CM | POA: Diagnosis present

## 2023-05-05 LAB — COMPREHENSIVE METABOLIC PANEL
ALT: 25 U/L (ref 0–44)
AST: 42 U/L — ABNORMAL HIGH (ref 15–41)
Albumin: 3.2 g/dL — ABNORMAL LOW (ref 3.5–5.0)
Alkaline Phosphatase: 76 U/L (ref 38–126)
Anion gap: 12 (ref 5–15)
BUN: 13 mg/dL (ref 6–20)
CO2: 32 mmol/L (ref 22–32)
Calcium: 8.2 mg/dL — ABNORMAL LOW (ref 8.9–10.3)
Chloride: 92 mmol/L — ABNORMAL LOW (ref 98–111)
Creatinine, Ser: 1.24 mg/dL — ABNORMAL HIGH (ref 0.44–1.00)
GFR, Estimated: 53 mL/min — ABNORMAL LOW (ref >60)
Glucose, Bld: 138 mg/dL — ABNORMAL HIGH (ref 70–99)
Potassium: 2.9 mmol/L — ABNORMAL LOW (ref 3.5–5.1)
Sodium: 136 mmol/L (ref 135–145)
Total Bilirubin: 2.1 mg/dL — ABNORMAL HIGH (ref 0.0–1.2)
Total Protein: 6.8 g/dL (ref 6.5–8.1)

## 2023-05-05 LAB — URINALYSIS, ROUTINE W REFLEX MICROSCOPIC
Glucose, UA: NEGATIVE mg/dL
Hgb urine dipstick: NEGATIVE
Ketones, ur: 20 mg/dL — AB
Leukocytes,Ua: NEGATIVE
Nitrite: NEGATIVE
Protein, ur: 30 mg/dL — AB
Specific Gravity, Urine: >1.046 — ABNORMAL HIGH (ref 1.005–1.030)
pH: 5 (ref 5.0–8.0)

## 2023-05-05 LAB — CBC
HCT: 45.4 % (ref 36.0–46.0)
Hemoglobin: 15.6 g/dL — ABNORMAL HIGH (ref 12.0–15.0)
MCH: 32.3 pg (ref 26.0–34.0)
MCHC: 34.4 g/dL (ref 30.0–36.0)
MCV: 94 fL (ref 80.0–100.0)
Platelets: 162 10*3/uL (ref 150–400)
RBC: 4.83 MIL/uL (ref 3.87–5.11)
RDW: 13 % (ref 11.5–15.5)
WBC: 6 10*3/uL (ref 4.0–10.5)
nRBC: 0.3 % — ABNORMAL HIGH (ref 0.0–0.2)

## 2023-05-05 LAB — GLUCOSE, CAPILLARY
Glucose-Capillary: 72 mg/dL (ref 70–99)
Glucose-Capillary: 88 mg/dL (ref 70–99)

## 2023-05-05 LAB — HEMOGLOBIN A1C
Hgb A1c MFr Bld: 4.9 % (ref 4.8–5.6)
Mean Plasma Glucose: 93.93 mg/dL

## 2023-05-05 LAB — LIPASE, BLOOD: Lipase: 49 U/L (ref 11–51)

## 2023-05-05 LAB — MAGNESIUM: Magnesium: 1.5 mg/dL — ABNORMAL LOW (ref 1.7–2.4)

## 2023-05-05 LAB — POTASSIUM: Potassium: 2.4 mmol/L — CL (ref 3.5–5.1)

## 2023-05-05 MED ORDER — ONDANSETRON HCL 4 MG/2ML IJ SOLN
4.0000 mg | Freq: Four times a day (QID) | INTRAMUSCULAR | Status: DC | PRN
  Administered 2023-05-07: 4 mg via INTRAVENOUS
  Filled 2023-05-05: qty 2

## 2023-05-05 MED ORDER — IOHEXOL 300 MG/ML  SOLN
100.0000 mL | Freq: Once | INTRAMUSCULAR | Status: AC | PRN
  Administered 2023-05-05: 100 mL via INTRAVENOUS

## 2023-05-05 MED ORDER — POTASSIUM CHLORIDE 10 MEQ/100ML IV SOLN
10.0000 meq | INTRAVENOUS | Status: AC
  Administered 2023-05-05: 10 meq via INTRAVENOUS
  Filled 2023-05-05: qty 100

## 2023-05-05 MED ORDER — METOCLOPRAMIDE HCL 5 MG/ML IJ SOLN
10.0000 mg | INTRAMUSCULAR | Status: AC
  Administered 2023-05-05: 10 mg via INTRAVENOUS
  Filled 2023-05-05: qty 2

## 2023-05-05 MED ORDER — INSULIN ASPART 100 UNIT/ML IJ SOLN: 0.0000 [IU] | Freq: Every day | INTRAMUSCULAR | Status: DC

## 2023-05-05 MED ORDER — POTASSIUM CHLORIDE 10 MEQ/100ML IV SOLN
10.0000 meq | INTRAVENOUS | Status: AC
Start: 2023-05-05 — End: 2023-05-05
  Administered 2023-05-05 (×2): 10 meq via INTRAVENOUS
  Filled 2023-05-05 (×3): qty 100

## 2023-05-05 MED ORDER — HEPARIN SODIUM (PORCINE) 5000 UNIT/ML IJ SOLN
5000.0000 [IU] | Freq: Three times a day (TID) | INTRAMUSCULAR | Status: DC
  Administered 2023-05-05 – 2023-05-07 (×5): 5000 [IU] via SUBCUTANEOUS
  Filled 2023-05-05 (×5): qty 1

## 2023-05-05 MED ORDER — INSULIN ASPART 100 UNIT/ML IJ SOLN: 0.0000 [IU] | Freq: Three times a day (TID) | INTRAMUSCULAR | Status: DC

## 2023-05-05 MED ORDER — LORAZEPAM 0.5 MG PO TABS
0.5000 mg | ORAL_TABLET | Freq: Four times a day (QID) | ORAL | Status: DC | PRN
Start: 2023-05-05 — End: 2023-05-07
  Administered 2023-05-06: 0.5 mg via ORAL
  Filled 2023-05-05: qty 1

## 2023-05-05 MED ORDER — ACETAMINOPHEN 650 MG RE SUPP: 650.0000 mg | Freq: Four times a day (QID) | RECTAL | Status: DC | PRN

## 2023-05-05 MED ORDER — SENNOSIDES-DOCUSATE SODIUM 8.6-50 MG PO TABS: 1.0000 | ORAL_TABLET | Freq: Every evening | ORAL | Status: DC | PRN

## 2023-05-05 MED ORDER — HYDRALAZINE HCL 20 MG/ML IJ SOLN: 5.0000 mg | Freq: Four times a day (QID) | INTRAMUSCULAR | Status: DC | PRN

## 2023-05-05 MED ORDER — SODIUM CHLORIDE 0.9 % IV BOLUS
1000.0000 mL | Freq: Once | INTRAVENOUS | Status: AC
  Administered 2023-05-05: 1000 mL via INTRAVENOUS

## 2023-05-05 MED ORDER — ONDANSETRON HCL 4 MG PO TABS: 4.0000 mg | ORAL_TABLET | Freq: Four times a day (QID) | ORAL | Status: DC | PRN

## 2023-05-05 MED ORDER — METOCLOPRAMIDE HCL 5 MG/ML IJ SOLN
10.0000 mg | Freq: Four times a day (QID) | INTRAMUSCULAR | Status: AC | PRN
  Administered 2023-05-06: 10 mg via INTRAVENOUS
  Filled 2023-05-05 (×2): qty 2

## 2023-05-05 MED ORDER — MAGNESIUM SULFATE 2 GM/50ML IV SOLN
2.0000 g | INTRAVENOUS | Status: AC
  Administered 2023-05-05: 2 g via INTRAVENOUS
  Filled 2023-05-05: qty 50

## 2023-05-05 MED ORDER — POTASSIUM CHLORIDE 10 MEQ/100ML IV SOLN
10.0000 meq | INTRAVENOUS | Status: AC
  Administered 2023-05-05 (×2): 10 meq via INTRAVENOUS
  Filled 2023-05-05 (×2): qty 100

## 2023-05-05 MED ORDER — ACETAMINOPHEN 325 MG PO TABS: 650.0000 mg | ORAL_TABLET | Freq: Four times a day (QID) | ORAL | Status: DC | PRN

## 2023-05-05 MED ORDER — POTASSIUM CHLORIDE 10 MEQ/100ML IV SOLN
10.0000 meq | INTRAVENOUS | Status: DC
Start: 2023-05-05 — End: 2023-05-06
  Administered 2023-05-05 – 2023-05-06 (×5): 10 meq via INTRAVENOUS
  Filled 2023-05-05 (×4): qty 100

## 2023-05-05 MED ORDER — LACTATED RINGERS IV SOLN: INTRAVENOUS | Status: DC

## 2023-05-05 MED ORDER — PNEUMOCOCCAL 20-VAL CONJ VACC 0.5 ML IM SUSY
0.5000 mL | PREFILLED_SYRINGE | INTRAMUSCULAR | Status: AC
  Administered 2023-05-06: 0.5 mL via INTRAMUSCULAR
  Filled 2023-05-05: qty 0.5

## 2023-05-05 NOTE — Assessment & Plan Note (Addendum)
 With hypomagnesemia Secondary to intractable nausea and vomiting Status post potassium chloride 10 mEq IV 2 doses ordered by EDP On admission I have ordered additional 4 doses of potassium chloride 10 mEq Timed potassium lab check at 1700 hrs. on day of admission Recheck BMP in a.m.  Addendum: Repeat potassium was 2.4.  Appears that patient has lost IV access.  IV team has been consulted by myself.  Potassium chloride 10 mEq IV, 6 additional doses ordered on admission

## 2023-05-05 NOTE — Assessment & Plan Note (Signed)
 Suspect secondary to hypomagnesemia however given that patient had duodenal switch surgery malabsorption because B12 deficiency can be a consideration Magnesium has been replaced, will recheck serum magnesium in the a.m. Check B12 level

## 2023-05-05 NOTE — Assessment & Plan Note (Signed)
 Home losartan 50 mg daily, furosemide 40 mg daily will not be resumed on admission in setting of AKI AM team to resume when the benefits outweigh the risk Hydralazine 5 mg IV every 6 hours as needed for SBP greater 165, 5 days ordered

## 2023-05-05 NOTE — ED Triage Notes (Signed)
 Pt arrived via POV with reports of nausea x 2 weeks, and vomiting x 1 week, decreased PO intake, pt states she is also having diarrhea, pt states she had duodenal switch surgery in January.   Pt reports procedure was completed in [facility], pt has follow up appt on March 6th.

## 2023-05-05 NOTE — Assessment & Plan Note (Signed)
 Home metformin p.o. and Ozempic subcutaneous will not be resumed on admission Insulin SSI with at bedtime coverage ordered Goal inpatient blood glucose levels 140-180

## 2023-05-05 NOTE — ED Notes (Signed)
Informed RN bed assigned 

## 2023-05-05 NOTE — Assessment & Plan Note (Signed)
 -  This complicates overall care and prognosis.

## 2023-05-05 NOTE — Hospital Course (Addendum)
 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  Vitals in the ED showed temperature of 98.1, respiration rate 20, heart rate 73, on repeat was 76, blood pressure 114/96, SpO2 of 98% on room air.  Serum sodium is 136, potassium 2.9, chloride 94, bicarb 32, BUN of 13, serum creatinine 1.24, EGFR 53, nonfasting blood glucose 138, WBC 6.0, hemoglobin 15.6.  Platelet was 163  T. bili was 2.1.  Lipase was 49.  UA has been ordered and pending collection.  ED treatment: Reglan 10 mg IV one-time dose, magnesium 2 g IV one-time dose, potassium chloride 10 mEq IV x 2 doses, sodium chloride 1 L bolus.

## 2023-05-05 NOTE — H&P (Signed)
 History and Physical   Stacie Shah:[id number] DOB: 08-14-1970 DOA: 05/05/2023  PCP: [facility]  Outpatient Specialists: Dr. Laban Emperor, pain medicine Patient coming from: Home via POV  I have personally briefly reviewed patient's old medical records in [facility] EMR.  Chief Concern: Nausea, vomiting  HPI: 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  Vitals in the ED showed temperature of 98.1, respiration rate 20, heart rate 73, on repeat was 76, blood pressure 114/96, SpO2 of 98% on room air.  Serum sodium is 136, potassium 2.9, chloride 94, bicarb 32, BUN of 13, serum creatinine 1.24, EGFR 53, nonfasting blood glucose 138, WBC 6.0, hemoglobin 15.6.  Platelet was 163  T. bili was 2.1.  Lipase was 49.  UA has been ordered and pending collection.  ED treatment: Reglan 10 mg IV one-time dose, magnesium 2 g IV one-time dose, potassium chloride 10 mEq IV x 2 doses, sodium chloride 1 L bolus. --------------------------------------- At bedside, patient was able to tell me her name, age, location, current calendar year.  She reports she has been nauseous, vomiting, not able to tolerate any p.o. intake for 2 weeks.  She reports that she has been compliant with the soft diet recommended by her weight loss surgeon.  She denies trauma to her person, chest pain, dysuria, hematuria, diarrhea, blood in her stool, fever, chills, cough.  Patient reports she has been having feelings of numbness in her bilateral legs and her anterior stomach.  She reports that she is able to feel fabric against her buttock, sensations of urination, and sensations of defecation.  She has not had any urinary or bowel incontinence.  Social  history: She lives at home on her own.  She denies tobacco, EtOH, recreational drug use.  She is disabled.  ROS: Constitutional: no weight change, no fever ENT/Mouth: no sore throat, no rhinorrhea Eyes: no eye pain, no vision changes Cardiovascular: no chest pain, no dyspnea,  no edema, no palpitations Respiratory: no cough, no sputum, no wheezing Gastrointestinal: + nausea, + vomiting, no diarrhea, no constipation Genitourinary: no urinary incontinence, no dysuria, no hematuria Musculoskeletal: no arthralgias, no myalgias Skin: no skin lesions, no pruritus, Neuro: + weakness, no loss of consciousness, no syncope Psych: no anxiety, no depression, + decrease appetite Heme/Lymph: no bruising, no bleeding  ED Course: Discussed with EDP, patient requiring hospitalization for chief concerns of intractable nausea and vomiting with elective light imbalance.  Assessment/Plan  Principal Problem:   AKI (acute kidney injury) (HCC) Active Problems:   HTN (hypertension)   HLD (hyperlipidemia)   GERD (gastroesophageal reflux disease)   OSA (obstructive sleep apnea)   Adjustment reaction with anxiety and depression   Anxiety   Benign essential HTN   Morbid obesity with BMI of 50.0-59.9, adult (HCC)   De Quervain's tenosynovitis   Depressive disorder   Metabolic syndrome   Atrial fibrillation (HCC)   Chronic pain syndrome   Diabetes mellitus type 2, noninsulin dependent (HCC)   Numbness   History of atrial fibrillation   Hypokalemia   Hypomagnesemia   Assessment and Plan:  * AKI (acute kidney injury) (HCC) Presumed secondary to prerenal in setting of intractable nausea and vomiting Status post sodium chloride 1 L bolus per EDP Will continue with lactated ringer at 125 mL/h, 1 day ordered on  admission Recheck BMP in the a.m.  HTN (hypertension) Home losartan 50 mg daily, furosemide 40 mg daily will not be resumed on admission in setting of AKI AM team to resume when the benefits  outweigh the risk Hydralazine 5 mg IV every 6 hours as needed for SBP greater 165, 5 days ordered  OSA (obstructive sleep apnea) CPAP nightly ordered  Hypomagnesemia Status post magnesium 2 g IV one-time dose ordered  Hypokalemia With hypomagnesemia Secondary to intractable nausea and vomiting Status post potassium chloride 10 mEq IV 2 doses ordered by EDP On admission I have ordered additional 4 doses of potassium chloride 10 mEq Timed potassium lab check at 1700 hrs. on day of admission Recheck BMP in a.m.  Addendum: Repeat potassium was 2.4.  Appears that patient has lost IV access.  IV team has been consulted by myself.  Potassium chloride 10 mEq IV, 6 additional doses ordered on admission  Numbness Suspect secondary to hypomagnesemia however given that patient had duodenal switch surgery malabsorption because B12 deficiency can be a consideration Magnesium has been replaced, will recheck serum magnesium in the a.m. Check B12 level  Diabetes mellitus type 2, noninsulin dependent (HCC) Home metformin p.o. and Ozempic subcutaneous will not be resumed on admission Insulin SSI with at bedtime coverage ordered Goal inpatient blood glucose levels 140-180  Chronic pain syndrome PDMP reviewed Patient currently has active prescription for pregabalin 100 mg capsule, for 30-day supply, 90 capsules that was written on 10/03/2022 and filled on 03/31/2023  Morbid obesity with BMI of 50.0-59.9, adult (HCC) This complicates overall care and prognosis.   Anxiety Lorazepam 0.5 mg p.o. every 6 hours as needed for anxiety, 2 doses ordered  Chart reviewed.   DVT prophylaxis: Heparin 5000 units subcutaneous every 8 hours Code Status: Full code Diet: N.p.o. with orders to advance as tolerated to clear liquid diet.  Patient is currently on clear liquid diet and I updated the advance as tolerated order from clear liquid diet to full liquid diet Family Communication: No Disposition Plan: pending  clinical course Consults called: None at this time Admission status: telemetry medical, observation  Past Medical History:  Diagnosis Date   Anxiety    Arrhythmia    atrial fibrillation   Arthritis    Asthma    CHF (congestive heart failure) (HCC)    Depression    Diabetes mellitus without complication (HCC)    GERD (gastroesophageal reflux disease)    Hypercholesteremia    Hypertension    Seizure (HCC)    as a child   Sleep apnea    Past Surgical History:  Procedure Laterality Date   ABDOMINAL SURGERY     Gastric Sleeve   CATARACT EXTRACTION W/PHACO Right 05/24/2019   Procedure: CATARACT EXTRACTION PHACO AND INTRAOCULAR LENS PLACEMENT (IOC) RIGHT DIABETIC;  Surgeon: Galen Manila, MD;  Location: [facility] ORS;  Service: Ophthalmology;  Laterality: Right;  [place] 00:23.6 CDE 1.88 Fluid Pack Lot # [id number]   CHOLECYSTECTOMY     FOOT SURGERY     cyst removal from heel   REPEAT CESAREAN SECTION     c-section x3   TYMPANOSTOMY     Social History:  reports that she has never smoked. She has never used smokeless tobacco. She reports current alcohol use. She reports that she does not use drugs.  Allergies  Allergen Reactions   Sulfa Antibiotics Rash   Family History  Problem Relation Age of Onset   Hypertension Mother    Hypertension Maternal Grandmother  Family history: Family history reviewed and not pertinent.  Prior to Admission medications   Medication Sig Start Date End Date Taking? Authorizing Provider  acetaminophen (TYLENOL) 325 MG tablet Take by mouth. 11/10/21   [provider]  AIRSUPRA 90-80 MCG/ACT AERO Inhale 2 Inhalations into the lungs 2 (two) times daily.    [provider]  albuterol (PROVENTIL) (2.5 MG/3ML) 0.083% nebulizer solution Take 3 mLs (2.5 mg total) by nebulization every 4 (four) hours as needed for up to 14 days for wheezing or shortness of breath. 08/08/22 01/03/23  Trinna Post, MD  albuterol (VENTOLIN HFA) 108 (90 Base) MCG/ACT  inhaler Inhale 2 puffs into the lungs every 4 (four) hours as needed for wheezing or shortness of breath.    [provider]  atorvastatin (LIPITOR) 10 MG tablet Take 10 mg by mouth daily.    [provider]  cyclobenzaprine (FLEXERIL) 10 MG tablet Take 10 mg by mouth 3 (three) times daily as needed for muscle spasms.    [provider]  DULoxetine (CYMBALTA) 60 MG capsule Take 60 mg by mouth 2 (two) times daily.     [provider]  fluticasone (FLONASE) 50 MCG/ACT nasal spray Place 2 sprays into both nostrils daily. prn 11/10/22 11/10/23  [provider]  fluticasone (FLOVENT HFA) 44 MCG/ACT inhaler Inhale 2 puffs into the lungs 2 (two) times daily.    [provider]  furosemide (LASIX) 40 MG tablet Take 1 tablet (40 mg total) by mouth daily. 07/29/21   Enedina Finner, MD  losartan (COZAAR) 50 MG tablet Take 50 mg by mouth daily.    [provider]  magnesium oxide (MAG-OX) 400 MG tablet Take 400 mg by mouth every other day. 07/27/10   [provider]  metFORMIN (GLUCOPHAGE-XR) 500 MG 24 hr tablet Take 500 mg by mouth daily.    [provider]  metoprolol tartrate 37.5 MG TABS Take 37.5 mg by mouth 2 (two) times daily. 07/28/21   Enedina Finner, MD  omeprazole (PRILOSEC) 20 MG capsule Take 20 mg by mouth daily. 08/31/15   [provider]  OZEMPIC, 2 MG/DOSE, 8 MG/3ML SOPN Inject 2 mg into the skin once a week. 10/13/22   [provider]  pregabalin (LYRICA) 50 MG capsule Take by mouth. 12/28/21   [provider]  traZODone (DESYREL) 150 MG tablet Take 150 mg by mouth at bedtime.    [provider]   Physical Exam: Vitals:   05/05/23 1100 05/05/23 1312 05/05/23 1531 05/05/23 1935  BP: (!) 137/48  (!) 113/98 135/65  Pulse: 73  68 83  Resp: 14  20 18   Temp:  98.1 F (36.7 C) 98.3 F (36.8 C) 98.2 F (36.8 C)  TempSrc:  Oral Oral Oral  SpO2: 98%  100% 100%  Weight:      Height:        Constitutional: appears older than chronological age, chronically ill, frail Eyes: PERRL, lids and conjunctivae normal ENMT: Mucous membranes are moist. Posterior pharynx clear of any exudate or lesions. Age-appropriate dentition. Hearing appropriate Neck: normal, supple, no masses, no thyromegaly Respiratory: clear to auscultation bilaterally, no wheezing, no crackles. Normal respiratory effort. No accessory muscle use.  Cardiovascular: Regular rate and rhythm, no murmurs / rubs / gallops. No extremity edema. 2+ pedal pulses. No carotid bruits.  Abdomen: Morbidly obese abdomen, no tenderness, no masses palpated, no hepatosplenomegaly. Bowel sounds positive.  Musculoskeletal: no clubbing / cyanosis. No joint deformity upper and lower extremities. Good  ROM, no contractures, no atrophy. Normal muscle tone.  Skin: no rashes, lesions, ulcers. No induration Neurologic: Sensation intact. Strength 5/5 in all 4.  Psychiatric: Normal judgment and insight. Alert and oriented x 3.  Depressed mood.   EKG: independently reviewed, showing sinus rhythm with rate of 98, QTc 482  Chest x-ray on Admission: I personally reviewed and I agree with radiologist reading as below.  CT ABDOMEN PELVIS W CONTRAST Result Date: 05/05/2023 CLINICAL DATA:  53 year old female with abdominal pain, nausea vomiting, status post abdominal surgery ("duodenal switch") in January. EXAM: CT ABDOMEN AND PELVIS WITH CONTRAST TECHNIQUE: Multidetector CT imaging of the abdomen and pelvis was performed using the standard protocol following bolus administration of intravenous contrast. RADIATION DOSE REDUCTION: This exam was performed according to the departmental dose-optimization program which includes automated exposure control, adjustment of the mA and/or kV according to patient size and/or use of iterative reconstruction technique. CONTRAST:  OMNIPAQUE IOHEXOL 300 MG/ML  SOLN COMPARISON:  CT Abdomen and Pelvis 09/06/2021.  FINDINGS: Lower chest: Heart size remains normal. No pericardial effusion. Negative lung bases. Hepatobiliary: Chronic cholecystectomy. Liver appears stable, negative. Pancreas: Stable with some pancreatic head and uncinate fatty atrophy. Spleen: Stable and negative. Adrenals/Urinary Tract: Normal adrenal glands. Nonobstructed kidneys with normal renal enhancement. No renal contrast excretion on the delayed images. Decompressed ureters and bladder. No urinary calculus. Stomach/Bowel: Decompressed large bowel to the rectum. Diminutive or absent appendix. Negative terminal ileum. Decompressed small bowel loops. Postoperative changes to the stomach with a distal gastrojejunostomy. Stomach is decompressed. No regional inflammation. The downstream small bowel loops are contrast containing and unremarkable. Blind-ending duodenal bulb region, duodenum is decompressed. Ligament of Treitz and proximal jejunum are decompressed. No free air, free fluid, or mesenteric inflammation is identified. Vascular/Lymphatic: Major arterial structures in the abdomen and pelvis appear patent and normal. Portal venous system appears to be patent. No calcified atherosclerosis or lymphadenopathy. Reproductive: Diminutive or absent uterus.  Normal ovaries. Other: No pelvic free fluid. Musculoskeletal: Chronic lower lumbar facet arthropathy, vacuum facet. Subtle associated L4-L5 spondylolisthesis. No acute osseous abnormality identified. IMPRESSION: 1. Postoperative changes to the stomach and duodenum with no adverse features identified. No evidence of bowel obstruction or inflammation. 2. Absent renal contrast excretion on the delayed images but no evidence of obstructive uropathy. Query acute intrinsic renal insufficiency. 3. No other acute or inflammatory process identified in the abdomen or pelvis. Electronically Signed   By: Odessa Fleming M.D.   On: 05/05/2023 10:49   Labs on Admission: I have personally reviewed following labs  CBC: Recent  Labs  Lab 05/05/23 0758  WBC 6.0  HGB 15.6*  HCT 45.4  MCV 94.0  PLT 162   Basic Metabolic Panel: Recent Labs  Lab 05/05/23 0758 05/05/23 1652  NA 136  --   K 2.9* 2.4*  CL 92*  --   CO2 32  --   GLUCOSE 138*  --   BUN 13  --   CREATININE 1.24*  --   CALCIUM 8.2*  --   MG 1.5*  --    GFR: Estimated Creatinine Clearance: 83 mL/min (A) (by C-G formula based on SCr of 1.24 mg/dL (H)).  Liver Function Tests: Recent Labs  Lab 05/05/23 0758  AST 42*  ALT 25  ALKPHOS 76  BILITOT 2.1*  PROT 6.8  ALBUMIN 3.2*   Recent Labs  Lab 05/05/23 0758  LIPASE 49   Urine analysis:    Component Value Date/Time   COLORURINE AMBER (A) 09/06/2021 0134  APPEARANCEUR CLOUDY (A) 09/06/2021 0134   LABSPEC 1.035 (H) 09/06/2021 0134   PHURINE 5.0 09/06/2021 0134   GLUCOSEU NEGATIVE 09/06/2021 0134   HGBUR SMALL (A) 09/06/2021 0134   BILIRUBINUR NEGATIVE 09/06/2021 0134   KETONESUR NEGATIVE 09/06/2021 0134   PROTEINUR 100 (A) 09/06/2021 0134   NITRITE NEGATIVE 09/06/2021 0134   LEUKOCYTESUR TRACE (A) 09/06/2021 0134   This document was prepared using Dragon Voice Recognition software and may include unintentional dictation errors.  Dr. Sedalia Muta [facility]  If 7PM-7AM, please contact overnight-coverage provider If 7AM-7PM, please contact day attending provider [email or web address]  05/05/2023, 7:40 PM

## 2023-05-05 NOTE — Assessment & Plan Note (Signed)
-   Lorazepam 0.5 mg p.o. every 6 hours as needed for anxiety, 2 doses ordered 

## 2023-05-05 NOTE — Plan of Care (Signed)

## 2023-05-05 NOTE — Assessment & Plan Note (Signed)
 Presumed secondary to prerenal in setting of intractable nausea and vomiting Status post sodium chloride 1 L bolus per EDP Will continue with lactated ringer at 125 mL/h, 1 day ordered on admission Recheck BMP in the a.m.

## 2023-05-05 NOTE — Assessment & Plan Note (Signed)
 PDMP reviewed Patient currently has active prescription for pregabalin 100 mg capsule, for 30-day supply, 90 capsules that was written on 10/03/2022 and filled on 03/31/2023

## 2023-05-05 NOTE — Progress Notes (Signed)
 VAST consult received to obtain IV access. Upon arrival patient with non-working IV in right [facility] and working IV in left [facility]. Pt verbalized she would like an IV that is not in the bend of her arm. Assessed bilateral lower arms with ultrasound; no appropriate vessels for USGIV. Pt reported she is currently dehydrated. Advised that once she gets her potassium runs and some fluids that we can re-assess for another IV. Pt verbalized understanding and agreement with this plan.

## 2023-05-05 NOTE — Assessment & Plan Note (Signed)
 Status post magnesium 2 g IV one-time dose ordered

## 2023-05-05 NOTE — ED Provider Notes (Signed)
 [facility] Provider Note    Event Date/Time   First MD Initiated Contact with Patient 05/05/23 [id number]     (approximate)   History   Chief Complaint: Abdominal Pain and Emesis   HPI  Stacie Shah is a 53 y.o. female with a history of hypertension, CHF, morbid obesity, recent duodenal switch surgery who comes ED complaining of nausea for the past 2 weeks, vomiting for the past 1 week, unable to eat or drink anything during this time.  Also having diarrhea.  She reports initially after surgery she was doing well, symptoms well-controlled, then she developed constipation, took Colace and now has diarrhea.  No black or bloody stool, no hematemesis.  She is feeling dehydrated and dizzy with standing.   Outside records reviewed noting surgery on March 22, 2023, laparoscopic duodenal switch by Dr. Darius Bump, uncomplicated procedure and hospital course.         Physical Exam   Triage Vital Signs: ED Triage Vitals  Encounter Vitals Group     BP 05/05/23 0745 (!) 114/96     Systolic BP Percentile --      Diastolic BP Percentile --      Pulse Rate 05/05/23 0745 78     Resp 05/05/23 0745 20     Temp 05/05/23 0745 98.6 F (37 C)     Temp Source 05/05/23 0745 Oral     SpO2 05/05/23 0745 100 %     Weight 05/05/23 0749 (!) 351 lb (159.2 kg)     Height 05/05/23 0749 5\' 5"  (1.651 m)     Head Circumference --      Peak Flow --      Pain Score 05/05/23 0750 10     Pain Loc --      Pain Education --      Exclude from Growth Chart --     Most recent vital signs: Vitals:   05/05/23 1100 05/05/23 1312  BP: (!) 137/48   Pulse: 73   Resp: 14   Temp:  98.1 F (36.7 C)  SpO2: 98%     General: Awake, no distress.  CV:  Good peripheral perfusion.  Regular rate and rhythm Resp:  Normal effort.  Clear to auscultation bilaterally Abd:  No distention.  Soft with diffuse upper abdominal tenderness.  Incisions well-healed Other:  Dry oral mucosa   ED  Results / Procedures / Treatments   Labs (all labs ordered are listed, but only abnormal results are displayed) Labs Reviewed  COMPREHENSIVE METABOLIC PANEL - Abnormal; Notable for the following components:      Result Value   Potassium 2.9 (*)    Chloride 92 (*)    Glucose, Bld 138 (*)    Creatinine, Ser 1.24 (*)    Calcium 8.2 (*)    Albumin 3.2 (*)    AST 42 (*)    Total Bilirubin 2.1 (*)    GFR, Estimated 53 (*)    All other components within normal limits  CBC - Abnormal; Notable for the following components:   Hemoglobin 15.6 (*)    nRBC 0.3 (*)    All other components within normal limits  MAGNESIUM - Abnormal; Notable for the following components:   Magnesium 1.5 (*)    All other components within normal limits  LIPASE, BLOOD  URINALYSIS, ROUTINE W REFLEX MICROSCOPIC     EKG Interpreted by me Sinus rhythm rate of 98.  Normal axis, normal intervals.  Poor R wave progression.  Normal ST segments and T waves.  1 PVC on the strip.   RADIOLOGY CT abdomen pelvis interpreted by me, no signs of bowel obstruction or free air.  Radiology report reviewed   PROCEDURES:  Procedures   MEDICATIONS ORDERED IN ED: Medications  potassium chloride 10 mEq in 100 mL IVPB (10 mEq Intravenous New Bag/Given 05/05/23 1305)  magnesium sulfate IVPB 2 g 50 mL (2 g Intravenous New Bag/Given 05/05/23 1307)  sodium chloride 0.9 % bolus 1,000 mL (0 mLs Intravenous Stopped 05/05/23 1307)  metoCLOPramide (REGLAN) injection 10 mg (10 mg Intravenous Given 05/05/23 0910)  iohexol (OMNIPAQUE) 300 MG/ML solution 100 mL (100 mLs Intravenous Contrast Given 05/05/23 0957)     IMPRESSION / MDM / ASSESSMENT AND PLAN / ED COURSE  I reviewed the triage vital signs and the nursing notes.  DDx: Dehydration, electrolyte derangement, AKI, anemia, pancreatitis, biliary disease, anastomotic stricture, anastomotic leak/pneumoperitoneum, intra-abdominal abscess, internal hernia, bowel  obstruction/ileus x Patient's presentation is most consistent with acute presentation with potential threat to life or bodily function.  Patient presents with abdominal pain, p.o. intolerance after recent bariatric surgery.  Differential is broad.  Vital signs are reassuring, nontoxic.  Will give IV fluids for hydration, obtain CT abdomen pelvis.   Clinical Course as of 05/05/23 1405  Fri May 05, 2023  1223 CT unremarkable.  Labs show hypomagnesemia and hypokalemia.  Will supplement and p.o. trial [PS]  1331 Still unable to tolerate PO. Will need to admit for intractable sx.  [PS]  1404 Case d/w hospitalist  [PS]    Clinical Course User Index [PS] Sharman Cheek, MD     FINAL CLINICAL IMPRESSION(S) / ED DIAGNOSES   Final diagnoses:  Intractable vomiting  Hypokalemia  Hypomagnesemia  AKI (acute kidney injury) (HCC)  Morbid obesity (HCC)  S/P biliopancreatic diversion with duodenal switch     Rx / DC Orders   ED Discharge Orders     None        Note:  This document was prepared using Dragon voice recognition software and may include unintentional dictation errors.   Sharman Cheek, MD 05/05/23 [phone]

## 2023-05-05 NOTE — ED Notes (Signed)
 Pt was given 2hour contrast by CT

## 2023-05-05 NOTE — Assessment & Plan Note (Signed)
 CPAP nightly ordered

## 2023-05-06 DIAGNOSIS — N179 Acute kidney failure, unspecified: Secondary | ICD-10-CM | POA: Diagnosis not present

## 2023-05-06 LAB — CBC
HCT: 38.3 % (ref 36.0–46.0)
Hemoglobin: 13.4 g/dL (ref 12.0–15.0)
MCH: 33.1 pg (ref 26.0–34.0)
MCHC: 35 g/dL (ref 30.0–36.0)
MCV: 94.6 fL (ref 80.0–100.0)
Platelets: 134 10*3/uL — ABNORMAL LOW (ref 150–400)
RBC: 4.05 MIL/uL (ref 3.87–5.11)
RDW: 13.2 % (ref 11.5–15.5)
WBC: 4.2 10*3/uL (ref 4.0–10.5)
nRBC: 0 % (ref 0.0–0.2)

## 2023-05-06 LAB — BASIC METABOLIC PANEL
Anion gap: 11 (ref 5–15)
BUN: 11 mg/dL (ref 6–20)
CO2: 29 mmol/L (ref 22–32)
Calcium: 7.6 mg/dL — ABNORMAL LOW (ref 8.9–10.3)
Chloride: 94 mmol/L — ABNORMAL LOW (ref 98–111)
Creatinine, Ser: 0.75 mg/dL (ref 0.44–1.00)
GFR, Estimated: >60 mL/min (ref >60)
Glucose, Bld: 95 mg/dL (ref 70–99)
Potassium: 2.7 mmol/L — CL (ref 3.5–5.1)
Sodium: 134 mmol/L — ABNORMAL LOW (ref 135–145)

## 2023-05-06 LAB — MAGNESIUM: Magnesium: 1.8 mg/dL (ref 1.7–2.4)

## 2023-05-06 LAB — GLUCOSE, CAPILLARY
Glucose-Capillary: 85 mg/dL (ref 70–99)
Glucose-Capillary: 80 mg/dL (ref 70–99)
Glucose-Capillary: 78 mg/dL (ref 70–99)
Glucose-Capillary: 116 mg/dL — ABNORMAL HIGH (ref 70–99)

## 2023-05-06 LAB — VITAMIN B12: Vitamin B-12: 981 pg/mL — ABNORMAL HIGH (ref 180–914)

## 2023-05-06 MED ORDER — MAGNESIUM SULFATE 2 GM/50ML IV SOLN
2.0000 g | Freq: Once | INTRAVENOUS | Status: AC
  Administered 2023-05-06: 2 g via INTRAVENOUS
  Filled 2023-05-06: qty 50

## 2023-05-06 MED ORDER — POTASSIUM CHLORIDE CRYS ER 20 MEQ PO TBCR
40.0000 meq | EXTENDED_RELEASE_TABLET | Freq: Two times a day (BID) | ORAL | Status: AC
  Administered 2023-05-06 (×2): 40 meq via ORAL
  Filled 2023-05-06 (×2): qty 2

## 2023-05-06 MED ORDER — MORPHINE SULFATE (PF) 2 MG/ML IV SOLN: 1.0000 mg | INTRAVENOUS | Status: DC | PRN

## 2023-05-06 MED ORDER — DULOXETINE HCL 30 MG PO CPEP
60.0000 mg | ORAL_CAPSULE | Freq: Two times a day (BID) | ORAL | Status: DC
  Administered 2023-05-06 – 2023-05-07 (×3): 60 mg via ORAL
  Filled 2023-05-06 (×3): qty 2

## 2023-05-06 MED ORDER — OXYCODONE-ACETAMINOPHEN 5-325 MG PO TABS: 1.0000 | ORAL_TABLET | Freq: Four times a day (QID) | ORAL | Status: DC | PRN

## 2023-05-06 MED ORDER — PREGABALIN 50 MG PO CAPS
50.0000 mg | ORAL_CAPSULE | Freq: Every day | ORAL | Status: DC
  Administered 2023-05-06 – 2023-05-07 (×2): 50 mg via ORAL
  Filled 2023-05-06 (×2): qty 1

## 2023-05-06 MED ORDER — ATORVASTATIN CALCIUM 10 MG PO TABS
10.0000 mg | ORAL_TABLET | Freq: Every day | ORAL | Status: DC
  Administered 2023-05-06 – 2023-05-07 (×2): 10 mg via ORAL
  Filled 2023-05-06 (×2): qty 1

## 2023-05-06 NOTE — Progress Notes (Signed)
 PROGRESS NOTE    Stacie Shah  WUJ:[id number] DOB: 12/21/1970 DOA: 05/05/2023 PCP: [facility]  Assessment & Plan:   Principal Problem:   AKI (acute kidney injury) (HCC) Active Problems:   HTN (hypertension)   HLD (hyperlipidemia)   GERD (gastroesophageal reflux disease)   OSA (obstructive sleep apnea)   Adjustment reaction with anxiety and depression   Anxiety   Benign essential HTN   Morbid obesity with BMI of 50.0-59.9, adult (HCC)   De Quervain's tenosynovitis   Depressive disorder   Metabolic syndrome   Atrial fibrillation (HCC)   Chronic pain syndrome   Diabetes mellitus type 2, noninsulin dependent (HCC)   Numbness   History of atrial fibrillation   Hypokalemia   Hypomagnesemia  Assessment and Plan:  AKI: likely prerenal. Continue on IVFs. Cr is trending down    HTN: holding home dose of losartan, lasix as BP is low normal    OSA: CPAP qhs   Hypomagnesemia: mg sulfate ordered    Hypokalemia: potassium given    Numbness: etiology unclear, possibly secondary to vitamin deficiency. B12 level is pending. Continue on home dose of duloxetine. Recently had weight loss surg    Chronic pain syndrome: restart home dose of pregabalin. Percocet, morphine prn while inpatient  PDMP reviewed   Morbid obesity: BMI 58.4. Complicates overall care & prognosis    Anxiety: severity unknown. Ativan prn       DVT prophylaxis: heparin  Code Status: full  Family Communication:  Disposition Plan: likely d/c home tomorrow   Level of care: Telemetry Medical  Status is: Observation The patient remains OBS appropriate and will d/c before 2 midnights.    Consultants:    Procedures:   Antimicrobials:    Subjective: Pt c/o malaise   Objective: Vitals:   05/05/23 1531 05/05/23 1935 05/06/23 0410 05/06/23 0802  BP: (!) 113/98 135/65 123/88 113/66  Pulse: 68 83 73 73  Resp: 20 18 20 18   Temp: 98.3 F (36.8 C) 98.2 F (36.8 C) 97.9 F (36.6  C) 98.4 F (36.9 C)  TempSrc: Oral Oral  Oral  SpO2: 100% 100% 98% 100%  Weight:      Height:        Intake/Output Summary (Last 24 hours) at 05/06/2023 0805 Last data filed at 05/06/2023 0305 Gross per 24 hour  Intake 1144.52 ml  Output --  Net 1144.52 ml   Filed Weights   05/05/23 0749  Weight: (!) 159.2 kg    Examination:  General exam: Appears calm and comfortable. Morbidly obese Respiratory system: decreased breath sounds b/l  Cardiovascular system: S1 & S2+. No rubs, gallops or clicks.  Gastrointestinal system: Abdomen is nondistended, soft and nontender. Normal bowel sounds heard. Central nervous system: Alert and oriented. Moves all extremities  Psychiatry: Judgement and insight appear normal. Flat mood and affect    Data Reviewed: I have personally reviewed following labs and imaging studies  CBC: Recent Labs  Lab 05/05/23 0758 05/06/23 0459  WBC 6.0 4.2  HGB 15.6* 13.4  HCT 45.4 38.3  MCV 94.0 94.6  PLT 162 134*   Basic Metabolic Panel: Recent Labs  Lab 05/05/23 0758 05/05/23 1652 05/06/23 0459  NA 136  --  134*  K 2.9* 2.4* 2.7*  CL 92*  --  94*  CO2 32  --  29  GLUCOSE 138*  --  95  BUN 13  --  11  CREATININE 1.24*  --  0.75  CALCIUM 8.2*  --  7.6*  MG 1.5*  --  1.8   GFR: Estimated Creatinine Clearance: 128.6 mL/min (by C-G formula based on SCr of 0.75 mg/dL). Liver Function Tests: Recent Labs  Lab 05/05/23 0758  AST 42*  ALT 25  ALKPHOS 76  BILITOT 2.1*  PROT 6.8  ALBUMIN 3.2*   Recent Labs  Lab 05/05/23 0758  LIPASE 49   No results for input(s): "AMMONIA" in the last 168 hours. Coagulation Profile: No results for input(s): "INR", "PROTIME" in the last 168 hours. Cardiac Enzymes: No results for input(s): "CKTOTAL", "CKMB", "CKMBINDEX", "TROPONINI" in the last 168 hours. BNP (last 3 results) No results for input(s): "PROBNP" in the last 8760 hours. HbA1C: Recent Labs    05/05/23 0758  HGBA1C 4.9   CBG: Recent Labs   Lab 05/05/23 1528 05/05/23 2121 05/06/23 0803  GLUCAP 72 88 85   Lipid Profile: No results for input(s): "CHOL", "HDL", "LDLCALC", "TRIG", "CHOLHDL", "LDLDIRECT" in the last 72 hours. Thyroid Function Tests: No results for input(s): "TSH", "T4TOTAL", "FREET4", "T3FREE", "THYROIDAB" in the last 72 hours. Anemia Panel: No results for input(s): "VITAMINB12", "FOLATE", "FERRITIN", "TIBC", "IRON", "RETICCTPCT" in the last 72 hours. Sepsis Labs: No results for input(s): "PROCALCITON", "LATICACIDVEN" in the last 168 hours.  No results found for this or any previous visit (from the past 240 hours).       Radiology Studies: CT ABDOMEN PELVIS W CONTRAST Result Date: 05/05/2023 CLINICAL DATA:  53 year old female with abdominal pain, nausea vomiting, status post abdominal surgery ("duodenal switch") in January. EXAM: CT ABDOMEN AND PELVIS WITH CONTRAST TECHNIQUE: Multidetector CT imaging of the abdomen and pelvis was performed using the standard protocol following bolus administration of intravenous contrast. RADIATION DOSE REDUCTION: This exam was performed according to the departmental dose-optimization program which includes automated exposure control, adjustment of the mA and/or kV according to patient size and/or use of iterative reconstruction technique. CONTRAST:  OMNIPAQUE IOHEXOL 300 MG/ML  SOLN COMPARISON:  CT Abdomen and Pelvis 09/06/2021. FINDINGS: Lower chest: Heart size remains normal. No pericardial effusion. Negative lung bases. Hepatobiliary: Chronic cholecystectomy. Liver appears stable, negative. Pancreas: Stable with some pancreatic head and uncinate fatty atrophy. Spleen: Stable and negative. Adrenals/Urinary Tract: Normal adrenal glands. Nonobstructed kidneys with normal renal enhancement. No renal contrast excretion on the delayed images. Decompressed ureters and bladder. No urinary calculus. Stomach/Bowel: Decompressed large bowel to the rectum. Diminutive or absent  appendix. Negative terminal ileum. Decompressed small bowel loops. Postoperative changes to the stomach with a distal gastrojejunostomy. Stomach is decompressed. No regional inflammation. The downstream small bowel loops are contrast containing and unremarkable. Blind-ending duodenal bulb region, duodenum is decompressed. Ligament of Treitz and proximal jejunum are decompressed. No free air, free fluid, or mesenteric inflammation is identified. Vascular/Lymphatic: Major arterial structures in the abdomen and pelvis appear patent and normal. Portal venous system appears to be patent. No calcified atherosclerosis or lymphadenopathy. Reproductive: Diminutive or absent uterus.  Normal ovaries. Other: No pelvic free fluid. Musculoskeletal: Chronic lower lumbar facet arthropathy, vacuum facet. Subtle associated L4-L5 spondylolisthesis. No acute osseous abnormality identified. IMPRESSION: 1. Postoperative changes to the stomach and duodenum with no adverse features identified. No evidence of bowel obstruction or inflammation. 2. Absent renal contrast excretion on the delayed images but no evidence of obstructive uropathy. Query acute intrinsic renal insufficiency. 3. No other acute or inflammatory process identified in the abdomen or pelvis. Electronically Signed   By: Odessa Fleming M.D.   On: 05/05/2023 10:49        Scheduled  Meds:  heparin  5,000 Units Subcutaneous Q8H   insulin aspart  0-20 Units Subcutaneous TID WC   insulin aspart  0-5 Units Subcutaneous QHS   pneumococcal 20-valent conjugate vaccine  0.5 mL Intramuscular Tomorrow-1000   potassium chloride  40 mEq Oral BID   Continuous Infusions:  lactated ringers 125 mL/hr at 05/06/23 0305     LOS: 0 days     Charise Killian, MD [facility] Pager [phone]  If 7PM-7AM, please contact night-coverage [email or web address] 05/06/2023, 8:05 AM

## 2023-05-06 NOTE — Plan of Care (Signed)
  Problem: Education: Goal: Ability to describe self-care measures that may prevent or decrease complications (Diabetes Survival Skills Education) will improve Outcome: Progressing Goal: Individualized Educational Video(s) Outcome: Progressing   Problem: Coping: Goal: Ability to adjust to condition or change in health will improve Outcome: Progressing   Problem: Fluid Volume: Goal: Ability to maintain a balanced intake and output will improve Outcome: Progressing   Problem: Health Behavior/Discharge Planning: Goal: Ability to identify and utilize available resources and services will improve Outcome: Progressing Goal: Ability to manage health-related needs will improve Outcome: Progressing   Problem: Metabolic: Goal: Ability to maintain appropriate glucose levels will improve Outcome: Progressing   Problem: Nutritional: Goal: Maintenance of adequate nutrition will improve Outcome: Progressing Goal: Progress toward achieving an optimal weight will improve Outcome: Progressing   Problem: Skin Integrity: Goal: Risk for impaired skin integrity will decrease Outcome: Progressing   Problem: Tissue Perfusion: Goal: Adequacy of tissue perfusion will improve Outcome: Progressing   Problem: Education: Goal: Knowledge of General Education information will improve Description: Including pain rating scale, medication(s)/side effects and non-pharmacologic comfort measures Outcome: Progressing   Problem: Clinical Measurements: Goal: Ability to maintain clinical measurements within normal limits will improve Outcome: Progressing Goal: Will remain free from infection Outcome: Progressing Goal: Diagnostic test results will improve Outcome: Progressing Goal: Respiratory complications will improve Outcome: Progressing Goal: Cardiovascular complication will be avoided Outcome: Progressing   Problem: Health Behavior/Discharge Planning: Goal: Ability to manage health-related needs  will improve Outcome: Progressing   Problem: Activity: Goal: Risk for activity intolerance will decrease Outcome: Progressing   Problem: Nutrition: Goal: Adequate nutrition will be maintained Outcome: Progressing   Problem: Pain Managment: Goal: General experience of comfort will improve and/or be controlled Outcome: Progressing   Problem: Skin Integrity: Goal: Risk for impaired skin integrity will decrease Outcome: Progressing   Problem: Safety: Goal: Ability to remain free from injury will improve Outcome: Progressing

## 2023-05-07 DIAGNOSIS — N179 Acute kidney failure, unspecified: Secondary | ICD-10-CM | POA: Diagnosis not present

## 2023-05-07 LAB — CBC
HCT: 38.2 % (ref 36.0–46.0)
Hemoglobin: 13 g/dL (ref 12.0–15.0)
MCH: 32.5 pg (ref 26.0–34.0)
MCHC: 34 g/dL (ref 30.0–36.0)
MCV: 95.5 fL (ref 80.0–100.0)
Platelets: 127 10*3/uL — ABNORMAL LOW (ref 150–400)
RBC: 4 MIL/uL (ref 3.87–5.11)
RDW: 13.2 % (ref 11.5–15.5)
WBC: 4.8 10*3/uL (ref 4.0–10.5)
nRBC: 0 % (ref 0.0–0.2)

## 2023-05-07 LAB — BASIC METABOLIC PANEL
Anion gap: 7 (ref 5–15)
BUN: 11 mg/dL (ref 6–20)
CO2: 29 mmol/L (ref 22–32)
Calcium: 8.1 mg/dL — ABNORMAL LOW (ref 8.9–10.3)
Chloride: 98 mmol/L (ref 98–111)
Creatinine, Ser: 0.84 mg/dL (ref 0.44–1.00)
GFR, Estimated: >60 mL/min (ref >60)
Glucose, Bld: 99 mg/dL (ref 70–99)
Potassium: 3.4 mmol/L — ABNORMAL LOW (ref 3.5–5.1)
Sodium: 134 mmol/L — ABNORMAL LOW (ref 135–145)

## 2023-05-07 LAB — GLUCOSE, CAPILLARY
Glucose-Capillary: 83 mg/dL (ref 70–99)
Glucose-Capillary: 74 mg/dL (ref 70–99)

## 2023-05-07 LAB — MAGNESIUM: Magnesium: 2 mg/dL (ref 1.7–2.4)

## 2023-05-07 MED ORDER — POTASSIUM CHLORIDE CRYS ER 20 MEQ PO TBCR
40.0000 meq | EXTENDED_RELEASE_TABLET | Freq: Once | ORAL | Status: AC
  Administered 2023-05-07: 40 meq via ORAL
  Filled 2023-05-07: qty 2

## 2023-05-07 MED ORDER — METOCLOPRAMIDE HCL 10 MG PO TABS: 10.0000 mg | ORAL_TABLET | Freq: Four times a day (QID) | ORAL | 0 refills | Status: AC | PRN

## 2023-05-07 NOTE — Plan of Care (Signed)

## 2023-05-07 NOTE — Discharge Summary (Signed)
 Physician Discharge Summary  Stacie Shah:[id number] DOB: 30-Jun-1970 DOA: 05/05/2023  PCP: [facility]  Admit date: 05/05/2023 Discharge date: 05/07/2023  Admitted From: home  Disposition:  home   Recommendations for Outpatient Follow-up:  Follow up with PCP in 1-2 weeks F/u w/ bariatric surg w/in 1 week    Home Health: no Equipment/Devices:  Discharge Condition stable  CODE STATUS: full  Diet recommendation: Heart Healthy   Brief/Interim Summary: HPI was taken from Dr. Sedalia Muta: 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   Vitals in the ED showed temperature of 98.1, respiration rate 20, heart rate 73, on repeat was 76, blood pressure 114/96, SpO2 of 98% on room air.   Serum sodium is 136, potassium 2.9, chloride 94, bicarb 32, BUN of 13, serum creatinine 1.24, EGFR 53, nonfasting blood glucose 138, WBC 6.0, hemoglobin 15.6.  Platelet was 163   T. bili was 2.1.  Lipase was 49.   UA has been ordered and pending collection.   ED treatment: Reglan 10 mg IV one-time dose, magnesium 2 g IV one-time dose, potassium chloride 10 mEq IV x 2 doses, sodium chloride 1 L bolus. --------------------------------------- At bedside, patient was able to tell me her name, age, location, current calendar year.   She reports she has been nauseous, vomiting, not able to tolerate any p.o. intake for 2 weeks.  She reports that she has been compliant with the soft diet recommended by her weight loss surgeon.  She denies trauma to her person, chest pain, dysuria, hematuria, diarrhea, blood in her stool, fever, chills, cough.   Patient reports she has been having feelings of numbness in her bilateral legs and her anterior stomach.   She reports that she is able to feel fabric against her buttock, sensations of urination, and sensations of defecation.  She has not had any urinary or bowel incontinence.  Discharge Diagnoses:  Principal Problem:   AKI (acute kidney injury) (HCC) Active Problems:   HTN (hypertension)   HLD (hyperlipidemia)   GERD (gastroesophageal reflux disease)   OSA (obstructive sleep apnea)   Adjustment reaction with anxiety and depression   Anxiety   Benign essential HTN   Morbid obesity with BMI of 50.0-59.9, adult (HCC)   De Quervain's tenosynovitis   Depressive disorder   Metabolic syndrome   Atrial fibrillation (HCC)   Chronic pain syndrome   Diabetes mellitus type 2, noninsulin dependent (HCC)   Numbness   History of atrial fibrillation   Hypokalemia   Hypomagnesemia  AKI: likely prerenal. Resolved     HTN: restart home dose of losartan, lasix at d/c    OSA: CPAP qhs   Hypomagnesemia: WNL today    Hypokalemia: KCl given   Numbness: etiology unclear, possibly secondary to peripheral neuropathy. B12 level is not low. Continue on home dose of duloxetine, pregabalin. Recently had weight loss surg    Chronic pain syndrome: restart home dose of pregabalin. Percocet, morphine prn while inpatient    Morbid obesity: BMI 58.4. Complicates overall care & prognosis    Anxiety: severity unknown. Ativan prn    Discharge Instructions  Discharge Instructions     Diet Carb Modified   Complete by: As directed    Discharge instructions   Complete by: As directed    F/u w/ bariatric surg  within 1 week. F/u w/ PCP in 1-2 weeks   Increase activity slowly   Complete by: As directed       Allergies as of 05/07/2023       Reactions   Sulfa Antibiotics Rash        Medication List     STOP taking these medications    promethazine 25 MG tablet Commonly known as: PHENERGAN       TAKE these medications    acetaminophen 325 MG tablet Commonly known as: TYLENOL Take by  mouth.   Airsupra 90-80 MCG/ACT Aero Generic drug: Albuterol-Budesonide Inhale 2 Inhalations into the lungs 2 (two) times daily.   albuterol 108 (90 Base) MCG/ACT inhaler Commonly known as: VENTOLIN HFA Inhale 2 puffs into the lungs every 4 (four) hours as needed for wheezing or shortness of breath.   albuterol (2.5 MG/3ML) 0.083% nebulizer solution Commonly known as: PROVENTIL Take 3 mLs (2.5 mg total) by nebulization every 4 (four) hours as needed for up to 14 days for wheezing or shortness of breath.   atorvastatin 10 MG tablet Commonly known as: LIPITOR Take 10 mg by mouth daily.   baclofen 10 MG tablet Commonly known as: LIORESAL Take 5-10 mg by mouth 3 (three) times daily as needed.   cyclobenzaprine 10 MG tablet Commonly known as: FLEXERIL Take 10 mg by mouth 3 (three) times daily as needed for muscle spasms.   DULoxetine 60 MG capsule Commonly known as: CYMBALTA Take 60 mg by mouth 2 (two) times daily.   Eliquis 2.5 MG Tabs tablet Generic drug: apixaban Take 2.5 mg by mouth 2 (two) times daily.   fluticasone 44 MCG/ACT inhaler Commonly known as: FLOVENT HFA Inhale 2 puffs into the lungs 2 (two) times daily.   fluticasone 50 MCG/ACT nasal spray Commonly known as: FLONASE Place 2 sprays into both nostrils daily. prn   furosemide 40 MG tablet Commonly known as: LASIX Take 1 tablet (40 mg total) by mouth daily.   losartan 50 MG tablet Commonly known as: COZAAR Take 50 mg by mouth daily.   magnesium oxide 400 MG tablet Commonly known as: MAG-OX Take 400 mg by mouth every other day.   metFORMIN 500 MG 24 hr tablet Commonly known as: GLUCOPHAGE-XR Take 500 mg by mouth daily.   metoCLOPramide 10 MG tablet Commonly known as: Reglan Take 1 tablet (10 mg total) by mouth every 6 (six) hours as needed for up to 7 days for nausea or vomiting.   Metoprolol Tartrate 37.5 MG Tabs Take 37.5 mg by mouth 2 (two) times daily.   Nurtec 75 MG Tbdp Generic drug:  Rimegepant Sulfate Take 75 mg by mouth daily as needed.  (migraine).   omeprazole 20 MG capsule Commonly known as: PRILOSEC Take 20 mg by mouth daily.   ondansetron 4 MG tablet Commonly known as: ZOFRAN Take 4 mg by mouth every 8 (eight) hours as needed for nausea or vomiting.   Ozempic (2 MG/DOSE) 8 MG/3ML Sopn Generic drug: Semaglutide (2 MG/DOSE) Inject 2 mg into the skin once a week.   pregabalin 50 MG capsule Commonly known as: LYRICA Take by mouth.   traZODone 150 MG tablet Commonly known as: DESYREL Take 150 mg by mouth at bedtime.   zonisamide 100 MG capsule Commonly known as: ZONEGRAN Take 100 mg by mouth 2 (two) times daily.        Allergies  Allergen Reactions   Sulfa Antibiotics Rash    Consultations:    Procedures/Studies: CT ABDOMEN  PELVIS W CONTRAST Result Date: 05/05/2023 CLINICAL DATA:  53 year old female with abdominal pain, nausea vomiting, status post abdominal surgery ("duodenal switch") in January. EXAM: CT ABDOMEN AND PELVIS WITH CONTRAST TECHNIQUE: Multidetector CT imaging of the abdomen and pelvis was performed using the standard protocol following bolus administration of intravenous contrast. RADIATION DOSE REDUCTION: This exam was performed according to the departmental dose-optimization program which includes automated exposure control, adjustment of the mA and/or kV according to patient size and/or use of iterative reconstruction technique. CONTRAST:  OMNIPAQUE IOHEXOL 300 MG/ML  SOLN COMPARISON:  CT Abdomen and Pelvis 09/06/2021. FINDINGS: Lower chest: Heart size remains normal. No pericardial effusion. Negative lung bases. Hepatobiliary: Chronic cholecystectomy. Liver appears stable, negative. Pancreas: Stable with some pancreatic head and uncinate fatty atrophy. Spleen: Stable and negative. Adrenals/Urinary Tract: Normal adrenal glands. Nonobstructed kidneys with normal renal enhancement. No renal contrast excretion on the delayed  images. Decompressed ureters and bladder. No urinary calculus. Stomach/Bowel: Decompressed large bowel to the rectum. Diminutive or absent appendix. Negative terminal ileum. Decompressed small bowel loops. Postoperative changes to the stomach with a distal gastrojejunostomy. Stomach is decompressed. No regional inflammation. The downstream small bowel loops are contrast containing and unremarkable. Blind-ending duodenal bulb region, duodenum is decompressed. Ligament of Treitz and proximal jejunum are decompressed. No free air, free fluid, or mesenteric inflammation is identified. Vascular/Lymphatic: Major arterial structures in the abdomen and pelvis appear patent and normal. Portal venous system appears to be patent. No calcified atherosclerosis or lymphadenopathy. Reproductive: Diminutive or absent uterus.  Normal ovaries. Other: No pelvic free fluid. Musculoskeletal: Chronic lower lumbar facet arthropathy, vacuum facet. Subtle associated L4-L5 spondylolisthesis. No acute osseous abnormality identified. IMPRESSION: 1. Postoperative changes to the stomach and duodenum with no adverse features identified. No evidence of bowel obstruction or inflammation. 2. Absent renal contrast excretion on the delayed images but no evidence of obstructive uropathy. Query acute intrinsic renal insufficiency. 3. No other acute or inflammatory process identified in the abdomen or pelvis. Electronically Signed   By: Odessa Fleming M.D.   On: 05/05/2023 10:49   (Echo, Carotid, EGD, Colonoscopy, ERCP)    Subjective: Pt c/o nausea   Discharge Exam: Vitals:   05/07/23 0412 05/07/23 0749  BP: 122/72 134/84  Pulse: 78 73  Resp: 16 18  Temp: 97.9 F (36.6 C) 98.1 F (36.7 C)  SpO2: 94% 100%   Vitals:   05/06/23 1454 05/06/23 2021 05/07/23 0412 05/07/23 0749  BP: 120/69 132/69 122/72 134/84  Pulse: 77 79 78 73  Resp: 18 12 16 18   Temp: 98.4 F (36.9 C) 98.2 F (36.8 C) 97.9 F (36.6 C) 98.1 F (36.7 C)  TempSrc: Oral  Oral Oral Oral  SpO2: 100% 95% 94% 100%  Weight:      Height:        General: Pt is alert, awake, not in acute distress. Morbid obesity  Cardiovascular: S1/S2 +, no rubs, no gallops Respiratory: CTA bilaterally, no wheezing, no rhonchi Abdominal: Soft, NT, obese, bowel sounds + Extremities:  no cyanosis    The results of significant diagnostics from this hospitalization (including imaging, microbiology, ancillary and laboratory) are listed below for reference.     Microbiology: No results found for this or any previous visit (from the past 240 hours).   Labs: BNP (last 3 results) Recent Labs    08/08/22 0914  BNP 27.5   Basic Metabolic Panel: Recent Labs  Lab 05/05/23 0758 05/05/23 1652 05/06/23 0459 05/07/23 0407  NA 136  --  134* 134*  K 2.9* 2.4* 2.7* 3.4*  CL 92*  --  94* 98  CO2 32  --  29 29  GLUCOSE 138*  --  95 99  BUN 13  --  11 11  CREATININE 1.24*  --  0.75 0.84  CALCIUM 8.2*  --  7.6* 8.1*  MG 1.5*  --  1.8 2.0   Liver Function Tests: Recent Labs  Lab 05/05/23 0758  AST 42*  ALT 25  ALKPHOS 76  BILITOT 2.1*  PROT 6.8  ALBUMIN 3.2*   Recent Labs  Lab 05/05/23 0758  LIPASE 49   No results for input(s): "AMMONIA" in the last 168 hours. CBC: Recent Labs  Lab 05/05/23 0758 05/06/23 0459 05/07/23 0407  WBC 6.0 4.2 4.8  HGB 15.6* 13.4 13.0  HCT 45.4 38.3 38.2  MCV 94.0 94.6 95.5  PLT 162 134* 127*   Cardiac Enzymes: No results for input(s): "CKTOTAL", "CKMB", "CKMBINDEX", "TROPONINI" in the last 168 hours. BNP: Invalid input(s): "POCBNP" CBG: Recent Labs  Lab 05/06/23 1214 05/06/23 1637 05/06/23 2046 05/07/23 0750 05/07/23 1112  GLUCAP 80 78 116* 83 74   D-Dimer No results for input(s): "DDIMER" in the last 72 hours. Hgb A1c Recent Labs    05/05/23 0758  HGBA1C 4.9   Lipid Profile No results for input(s): "CHOL", "HDL", "LDLCALC", "TRIG", "CHOLHDL", "LDLDIRECT" in the last 72 hours. Thyroid function studies No  results for input(s): "TSH", "T4TOTAL", "T3FREE", "THYROIDAB" in the last 72 hours.  Invalid input(s): "FREET3" Anemia work up Recent Labs    05/06/23 0459  VITAMINB12 981*   Urinalysis    Component Value Date/Time   COLORURINE AMBER (A) 05/05/2023 2002   APPEARANCEUR CLOUDY (A) 05/05/2023 2002   LABSPEC >1.046 (H) 05/05/2023 2002   PHURINE 5.0 05/05/2023 2002   GLUCOSEU NEGATIVE 05/05/2023 2002   HGBUR NEGATIVE 05/05/2023 2002   BILIRUBINUR SMALL (A) 05/05/2023 2002   KETONESUR 20 (A) 05/05/2023 2002   PROTEINUR 30 (A) 05/05/2023 2002   NITRITE NEGATIVE 05/05/2023 2002   LEUKOCYTESUR NEGATIVE 05/05/2023 2002   Sepsis Labs Recent Labs  Lab 05/05/23 0758 05/06/23 0459 05/07/23 0407  WBC 6.0 4.2 4.8   Microbiology No results found for this or any previous visit (from the past 240 hours).   Time coordinating discharge: Over 30 minutes  SIGNED:   Charise Killian, MD  [facility] 05/07/2023, 11:41 AM Pager   If 7PM-7AM, please contact night-coverage [email or web address]

## 2023-05-22 ENCOUNTER — Emergency Department

## 2023-05-22 ENCOUNTER — Other Ambulatory Visit: Payer: Self-pay

## 2023-05-22 DIAGNOSIS — E876 Hypokalemia: Secondary | ICD-10-CM | POA: Insufficient documentation

## 2023-05-22 DIAGNOSIS — E119 Type 2 diabetes mellitus without complications: Secondary | ICD-10-CM | POA: Insufficient documentation

## 2023-05-22 DIAGNOSIS — I5032 Chronic diastolic (congestive) heart failure: Secondary | ICD-10-CM | POA: Diagnosis not present

## 2023-05-22 DIAGNOSIS — Z7951 Long term (current) use of inhaled steroids: Secondary | ICD-10-CM | POA: Insufficient documentation

## 2023-05-22 DIAGNOSIS — R1084 Generalized abdominal pain: Secondary | ICD-10-CM | POA: Diagnosis not present

## 2023-05-22 DIAGNOSIS — Z79899 Other long term (current) drug therapy: Secondary | ICD-10-CM | POA: Diagnosis not present

## 2023-05-22 DIAGNOSIS — I480 Paroxysmal atrial fibrillation: Secondary | ICD-10-CM | POA: Diagnosis not present

## 2023-05-22 DIAGNOSIS — I110 Hypertensive heart disease with heart failure: Secondary | ICD-10-CM | POA: Diagnosis not present

## 2023-05-22 DIAGNOSIS — Z7984 Long term (current) use of oral hypoglycemic drugs: Secondary | ICD-10-CM | POA: Insufficient documentation

## 2023-05-22 DIAGNOSIS — Z7901 Long term (current) use of anticoagulants: Secondary | ICD-10-CM | POA: Diagnosis not present

## 2023-05-22 DIAGNOSIS — I509 Heart failure, unspecified: Secondary | ICD-10-CM | POA: Diagnosis not present

## 2023-05-22 DIAGNOSIS — R112 Nausea with vomiting, unspecified: Secondary | ICD-10-CM | POA: Insufficient documentation

## 2023-05-22 DIAGNOSIS — Z9884 Bariatric surgery status: Secondary | ICD-10-CM | POA: Diagnosis not present

## 2023-05-22 DIAGNOSIS — J45909 Unspecified asthma, uncomplicated: Secondary | ICD-10-CM | POA: Insufficient documentation

## 2023-05-22 DIAGNOSIS — R42 Dizziness and giddiness: Secondary | ICD-10-CM | POA: Insufficient documentation

## 2023-05-22 LAB — COMPREHENSIVE METABOLIC PANEL
ALT: 16 U/L (ref 0–44)
AST: 25 U/L (ref 15–41)
Albumin: 3.2 g/dL — ABNORMAL LOW (ref 3.5–5.0)
Alkaline Phosphatase: 62 U/L (ref 38–126)
Anion gap: 17 — ABNORMAL HIGH (ref 5–15)
BUN: 18 mg/dL (ref 6–20)
CO2: 20 mmol/L — ABNORMAL LOW (ref 22–32)
Calcium: 8.8 mg/dL — ABNORMAL LOW (ref 8.9–10.3)
Chloride: 99 mmol/L (ref 98–111)
Creatinine, Ser: 0.75 mg/dL (ref 0.44–1.00)
GFR, Estimated: >60 mL/min (ref >60)
Glucose, Bld: 107 mg/dL — ABNORMAL HIGH (ref 70–99)
Potassium: 3.2 mmol/L — ABNORMAL LOW (ref 3.5–5.1)
Sodium: 136 mmol/L (ref 135–145)
Total Bilirubin: 2.1 mg/dL — ABNORMAL HIGH (ref 0.0–1.2)
Total Protein: 7.1 g/dL (ref 6.5–8.1)

## 2023-05-22 LAB — CBC WITH DIFFERENTIAL/PLATELET
Abs Immature Granulocytes: 0.02 10*3/uL (ref 0.00–0.07)
Basophils Absolute: 0 10*3/uL (ref 0.0–0.1)
Basophils Relative: 0 %
Eosinophils Absolute: 0.1 10*3/uL (ref 0.0–0.5)
Eosinophils Relative: 2 %
HCT: 44.4 % (ref 36.0–46.0)
Hemoglobin: 15.1 g/dL — ABNORMAL HIGH (ref 12.0–15.0)
Immature Granulocytes: 0 %
Lymphocytes Relative: 29 %
Lymphs Abs: 2.2 10*3/uL (ref 0.7–4.0)
MCH: 32.7 pg (ref 26.0–34.0)
MCHC: 34 g/dL (ref 30.0–36.0)
MCV: 96.1 fL (ref 80.0–100.0)
Monocytes Absolute: 0.5 10*3/uL (ref 0.1–1.0)
Monocytes Relative: 7 %
Neutro Abs: 4.5 10*3/uL (ref 1.7–7.7)
Neutrophils Relative %: 62 %
Platelets: 230 10*3/uL (ref 150–400)
RBC: 4.62 MIL/uL (ref 3.87–5.11)
RDW: 12.4 % (ref 11.5–15.5)
WBC: 7.4 10*3/uL (ref 4.0–10.5)
nRBC: 0 % (ref 0.0–0.2)

## 2023-05-22 LAB — LIPASE, BLOOD: Lipase: 46 U/L (ref 11–51)

## 2023-05-22 LAB — TROPONIN I (HIGH SENSITIVITY)
Troponin I (High Sensitivity): 9 ng/L (ref <18)
Troponin I (High Sensitivity): 8 ng/L (ref <18)

## 2023-05-22 MED ORDER — IOHEXOL 350 MG/ML SOLN
100.0000 mL | Freq: Once | INTRAVENOUS | Status: AC | PRN
  Administered 2023-05-22: 100 mL via INTRAVENOUS

## 2023-05-22 NOTE — ED Triage Notes (Signed)
 Pt comes with month of dizziness. Pt states recent weight loss surgery in Jan. Pt states she was here few weeks ago for vomiting. Pt states she can' t eat and barely drinks.

## 2023-05-22 NOTE — ED Provider Triage Note (Signed)
 Emergency Medicine Provider Triage Evaluation Note  Stacie Shah , a 53 y.o. female  was evaluated in triage.  Pt complains of weakness, dizziness, abd pain. Ongoing x 1 month following weight loss surgery. Poor intake, weak, dizzy, has fallen from the dizziness.  Review of Systems  Positive: Weak, dizzy, lower abd pain Negative: CP, shob, fever  Physical Exam  Ht 5\' 5"  (1.651 m)   Wt (!) 147.9 kg   LMP  (LMP Unknown) Comment: states last period was months ago, and takes [facility] to not bleed.  BMI 54.25 kg/m  Gen:   Awake, no distress   Resp:  Normal effort  MSK:   Moves extremities without difficulty  Other:    Medical Decision Making  Medically screening exam initiated at 6:04 PM.  Appropriate orders placed.  Ardeth Perfect was informed that the remainder of the evaluation will be completed by another provider, this initial triage assessment does not replace that evaluation, and the importance of remaining in the ED until their evaluation is complete.  Labs, EKG, urinalysis, CT abd/pel   Racheal Patches, PA-C 05/22/23 1807

## 2023-05-23 ENCOUNTER — Emergency Department

## 2023-05-23 ENCOUNTER — Encounter: Payer: Self-pay | Admitting: Radiology

## 2023-05-23 ENCOUNTER — Emergency Department
Admission: EM | Admit: 2023-05-23 | Discharge: 2023-05-25 | Disposition: A | Discharge status: Home / Self Care | Attending: Emergency Medicine | Admitting: Emergency Medicine

## 2023-05-23 DIAGNOSIS — I480 Paroxysmal atrial fibrillation: Secondary | ICD-10-CM | POA: Diagnosis present

## 2023-05-23 DIAGNOSIS — I10 Essential (primary) hypertension: Secondary | ICD-10-CM | POA: Diagnosis present

## 2023-05-23 DIAGNOSIS — R42 Dizziness and giddiness: Secondary | ICD-10-CM

## 2023-05-23 DIAGNOSIS — R112 Nausea with vomiting, unspecified: Secondary | ICD-10-CM | POA: Insufficient documentation

## 2023-05-23 DIAGNOSIS — I509 Heart failure, unspecified: Secondary | ICD-10-CM

## 2023-05-23 DIAGNOSIS — R1084 Generalized abdominal pain: Secondary | ICD-10-CM

## 2023-05-23 DIAGNOSIS — Z9884 Bariatric surgery status: Secondary | ICD-10-CM | POA: Diagnosis not present

## 2023-05-23 DIAGNOSIS — E876 Hypokalemia: Secondary | ICD-10-CM | POA: Diagnosis not present

## 2023-05-23 LAB — BASIC METABOLIC PANEL
Anion gap: 8 (ref 5–15)
BUN: 19 mg/dL (ref 6–20)
CO2: 22 mmol/L (ref 22–32)
Calcium: 8.2 mg/dL — ABNORMAL LOW (ref 8.9–10.3)
Chloride: 106 mmol/L (ref 98–111)
Creatinine, Ser: 0.78 mg/dL (ref 0.44–1.00)
GFR, Estimated: >60 mL/min (ref >60)
Glucose, Bld: 103 mg/dL — ABNORMAL HIGH (ref 70–99)
Potassium: 3.4 mmol/L — ABNORMAL LOW (ref 3.5–5.1)
Sodium: 136 mmol/L (ref 135–145)

## 2023-05-23 LAB — URINALYSIS, ROUTINE W REFLEX MICROSCOPIC
Bacteria, UA: NONE SEEN
Glucose, UA: NEGATIVE mg/dL
Hgb urine dipstick: NEGATIVE
Ketones, ur: 80 mg/dL — AB
Leukocytes,Ua: NEGATIVE
Nitrite: NEGATIVE
Protein, ur: 30 mg/dL — AB
RBC / HPF: 0 RBC/hpf (ref 0–5)
Specific Gravity, Urine: >1.046 — ABNORMAL HIGH (ref 1.005–1.030)
WBC, UA: 0 WBC/hpf (ref 0–5)
pH: 5 (ref 5.0–8.0)

## 2023-05-23 LAB — RESP PANEL BY RT-PCR (RSV, FLU A&B, COVID)  RVPGX2
Influenza A by PCR: NEGATIVE
Influenza B by PCR: NEGATIVE
Resp Syncytial Virus by PCR: NEGATIVE
SARS Coronavirus 2 by RT PCR: NEGATIVE

## 2023-05-23 LAB — CBC
HCT: 40.7 % (ref 36.0–46.0)
Hemoglobin: 13.7 g/dL (ref 12.0–15.0)
MCH: 32.2 pg (ref 26.0–34.0)
MCHC: 33.7 g/dL (ref 30.0–36.0)
MCV: 95.8 fL (ref 80.0–100.0)
Platelets: 220 10*3/uL (ref 150–400)
RBC: 4.25 MIL/uL (ref 3.87–5.11)
RDW: 12.7 % (ref 11.5–15.5)
WBC: 7 10*3/uL (ref 4.0–10.5)
nRBC: 0 % (ref 0.0–0.2)

## 2023-05-23 LAB — MAGNESIUM
Magnesium: 1.6 mg/dL — ABNORMAL LOW (ref 1.7–2.4)
Magnesium: 1.8 mg/dL (ref 1.7–2.4)

## 2023-05-23 MED ORDER — METOPROLOL TARTRATE 25 MG PO TABS
37.5000 mg | ORAL_TABLET | Freq: Two times a day (BID) | ORAL | Status: DC
  Administered 2023-05-23 – 2023-05-25 (×4): 37.5 mg via ORAL
  Filled 2023-05-23 (×5): qty 2

## 2023-05-23 MED ORDER — ATORVASTATIN CALCIUM 20 MG PO TABS
10.0000 mg | ORAL_TABLET | Freq: Every day | ORAL | Status: DC
  Administered 2023-05-23 – 2023-05-25 (×3): 10 mg via ORAL
  Filled 2023-05-23 (×3): qty 1

## 2023-05-23 MED ORDER — SODIUM CHLORIDE 0.9 % IV BOLUS (SEPSIS)
1000.0000 mL | Freq: Once | INTRAVENOUS | Status: AC
  Administered 2023-05-23: 1000 mL via INTRAVENOUS

## 2023-05-23 MED ORDER — FOLIC ACID 5 MG/ML IJ SOLN
1.0000 mg | Freq: Every day | INTRAMUSCULAR | Status: DC
  Administered 2023-05-23 – 2023-05-25 (×3): 1 mg via INTRAVENOUS
  Filled 2023-05-23 (×3): qty 0.2

## 2023-05-23 MED ORDER — PREGABALIN 50 MG PO CAPS
100.0000 mg | ORAL_CAPSULE | Freq: Three times a day (TID) | ORAL | Status: DC
  Administered 2023-05-23 – 2023-05-25 (×7): 100 mg via ORAL
  Filled 2023-05-23 (×7): qty 2

## 2023-05-23 MED ORDER — ZONISAMIDE 100 MG PO CAPS
100.0000 mg | ORAL_CAPSULE | Freq: Every day | ORAL | Status: DC
  Administered 2023-05-23 – 2023-05-25 (×3): 100 mg via ORAL
  Filled 2023-05-23 (×3): qty 1

## 2023-05-23 MED ORDER — DULOXETINE HCL 60 MG PO CPEP
60.0000 mg | ORAL_CAPSULE | Freq: Two times a day (BID) | ORAL | Status: DC
  Administered 2023-05-23 – 2023-05-25 (×4): 60 mg via ORAL
  Filled 2023-05-23 (×3): qty 1

## 2023-05-23 MED ORDER — THIAMINE HCL 100 MG/ML IJ SOLN
100.0000 mg | Freq: Once | INTRAMUSCULAR | Status: AC
  Administered 2023-05-23: 100 mg via INTRAVENOUS
  Filled 2023-05-23: qty 2

## 2023-05-23 MED ORDER — LACTATED RINGERS IV SOLN: INTRAVENOUS | Status: AC

## 2023-05-23 MED ORDER — BACLOFEN 10 MG PO TABS
10.0000 mg | ORAL_TABLET | Freq: Two times a day (BID) | ORAL | Status: DC | PRN
  Administered 2023-05-24: 10 mg via ORAL
  Filled 2023-05-23: qty 1

## 2023-05-23 MED ORDER — PANTOPRAZOLE SODIUM 40 MG IV SOLR
40.0000 mg | INTRAVENOUS | Status: DC
  Administered 2023-05-23 – 2023-05-25 (×3): 40 mg via INTRAVENOUS
  Filled 2023-05-23 (×3): qty 10

## 2023-05-23 MED ORDER — DULOXETINE HCL 60 MG PO CPEP
60.0000 mg | ORAL_CAPSULE | Freq: Two times a day (BID) | ORAL | Status: DC
  Filled 2023-05-23: qty 1

## 2023-05-23 MED ORDER — THIAMINE HCL 100 MG/ML IJ SOLN
100.0000 mg | Freq: Every day | INTRAMUSCULAR | Status: DC
  Administered 2023-05-23 – 2023-05-25 (×3): 100 mg via INTRAVENOUS
  Filled 2023-05-23 (×3): qty 2

## 2023-05-23 MED ORDER — MAGNESIUM SULFATE 2 GM/50ML IV SOLN: 2.0000 g | Freq: Once | INTRAVENOUS | Status: DC

## 2023-05-23 MED ORDER — MAGNESIUM SULFATE 2 GM/50ML IV SOLN
2.0000 g | Freq: Once | INTRAVENOUS | Status: AC
  Administered 2023-05-23: 2 g via INTRAVENOUS
  Filled 2023-05-23: qty 50

## 2023-05-23 MED ORDER — FOLIC ACID 5 MG/ML IJ SOLN
1.0000 mg | Freq: Once | INTRAMUSCULAR | Status: AC
  Administered 2023-05-23: 1 mg via INTRAVENOUS
  Filled 2023-05-23: qty 0.2

## 2023-05-23 MED ORDER — LORAZEPAM 2 MG/ML IJ SOLN
1.0000 mg | Freq: Once | INTRAMUSCULAR | Status: AC
Start: 2023-05-23 — End: 2023-05-23
  Administered 2023-05-23: 1 mg via INTRAVENOUS
  Filled 2023-05-23: qty 1

## 2023-05-23 MED ORDER — POTASSIUM CHLORIDE 10 MEQ/100ML IV SOLN
10.0000 meq | INTRAVENOUS | Status: AC
  Administered 2023-05-23 (×2): 10 meq via INTRAVENOUS
  Filled 2023-05-23 (×2): qty 100

## 2023-05-23 MED ORDER — SODIUM CHLORIDE 0.9 % IV SOLN: INTRAVENOUS | Status: DC

## 2023-05-23 MED ORDER — METOCLOPRAMIDE HCL 5 MG/ML IJ SOLN: 5.0000 mg | Freq: Four times a day (QID) | INTRAMUSCULAR | Status: DC | PRN

## 2023-05-23 MED ORDER — ONDANSETRON HCL 4 MG/2ML IJ SOLN
4.0000 mg | Freq: Once | INTRAMUSCULAR | Status: AC
  Administered 2023-05-23: 4 mg via INTRAVENOUS
  Filled 2023-05-23: qty 2

## 2023-05-23 MED ORDER — HYDROCODONE-ACETAMINOPHEN 5-325 MG PO TABS
1.0000 | ORAL_TABLET | Freq: Once | ORAL | Status: AC
  Administered 2023-05-23: 1 via ORAL
  Filled 2023-05-23: qty 1

## 2023-05-23 MED ORDER — TRAZODONE HCL 50 MG PO TABS
150.0000 mg | ORAL_TABLET | Freq: Every day | ORAL | Status: DC
  Administered 2023-05-23 – 2023-05-24 (×2): 150 mg via ORAL
  Filled 2023-05-23 (×2): qty 1

## 2023-05-23 MED ORDER — MECLIZINE HCL 25 MG PO TABS
50.0000 mg | ORAL_TABLET | Freq: Once | ORAL | Status: AC
  Administered 2023-05-23: 50 mg via ORAL
  Filled 2023-05-23: qty 2

## 2023-05-23 MED ORDER — FOLIC ACID 5 MG/ML IJ SOLN
1.0000 mg | Freq: Every day | INTRAMUSCULAR | Status: DC
  Filled 2023-05-23 (×2): qty 0.2

## 2023-05-23 MED ORDER — ONDANSETRON HCL 4 MG/2ML IJ SOLN: 4.0000 mg | Freq: Four times a day (QID) | INTRAMUSCULAR | Status: DC | PRN

## 2023-05-23 NOTE — ED Notes (Signed)
 Writer spoke with Florentina Addison from [facility] transfer about an update on the pt.

## 2023-05-23 NOTE — ED Provider Notes (Signed)
 [facility] Provider Note    Event Date/Time   First MD Initiated Contact with Patient 05/23/23 0022     (approximate)   History   Dizziness   HPI  Stacie Shah is a 53 y.o. 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 Lambert Mody at [facility] who presents to the emergency department complaints of vertigo that she describes as a room spinning worse when she turns her head ongoing for the past month.  Worsened over the past few days.  She also reports she has had multiple falls because of this but no injury from her falls.  She is complaining of nausea and vomiting that has been ongoing for the past month and unable to keep anything down.  No diarrhea.  Is complaining of abdominal pain.  States she contacted her bariatric surgeon at [facility] who is concerned that she could have a surgical complication and wanted her to come to the hospital there for admission for hydration, electrolyte and vitamin replacement and upper GI series.  Patient states that she could not make it to [facility] so that is why she is here at [facility].  She denies any fevers, dysuria or hematuria, vaginal bleeding or discharge, chest pain or shortness of breath.  She does report feeling like her entire body is numb and tingling.   Pre-operative weight/ Body Mass Index: 375 lbs and BMI 62.4 kg/m2.   Weight here is 352.4 pounds.   History provided by patient.    Past Medical History:  Diagnosis Date   Anxiety    Arrhythmia    atrial fibrillation   Arthritis    Asthma    CHF (congestive heart failure) (HCC)    Depression    Diabetes mellitus without complication (HCC)    GERD (gastroesophageal reflux disease)    Hypercholesteremia    Hypertension    Seizure (HCC)    as a child   Sleep apnea     Past Surgical  History:  Procedure Laterality Date   ABDOMINAL SURGERY     Gastric Sleeve   CATARACT EXTRACTION W/PHACO Right 05/24/2019   Procedure: CATARACT EXTRACTION PHACO AND INTRAOCULAR LENS PLACEMENT (IOC) RIGHT DIABETIC;  Surgeon: Galen Manila, MD;  Location: [facility] ORS;  Service: Ophthalmology;  Laterality: Right;  [place] 00:23.6 CDE 1.88 Fluid Pack Lot # [id number]   CHOLECYSTECTOMY     FOOT SURGERY     cyst removal from heel   REPEAT CESAREAN SECTION     c-section x3   TYMPANOSTOMY      MEDICATIONS:  Prior to Admission medications   Medication Sig Start Date End Date Taking? Authorizing Provider  acetaminophen (TYLENOL) 325 MG tablet Take by mouth. 11/10/21   [provider]  AIRSUPRA 90-80 MCG/ACT AERO Inhale 2 Inhalations into the lungs 2 (two) times daily.    [provider]  albuterol (PROVENTIL) (2.5 MG/3ML) 0.083% nebulizer solution Take 3 mLs (2.5 mg total) by nebulization every 4 (four) hours as needed for up to 14 days for wheezing or shortness of breath. 08/08/22 01/03/23  Trinna Post, MD  albuterol (VENTOLIN HFA) 108 (90 Base) MCG/ACT inhaler Inhale 2 puffs into the lungs every 4 (four) hours as needed for wheezing or shortness of breath.    [provider]  atorvastatin (LIPITOR) 10 MG tablet Take 10 mg by mouth daily.    [provider]  baclofen (LIORESAL) 10 MG tablet Take 5-10 mg by mouth 3 (three) times daily as needed.    [provider]  cyclobenzaprine (FLEXERIL) 10 MG tablet Take 10 mg by mouth 3 (three) times daily as needed for muscle spasms.    [provider]  DULoxetine (CYMBALTA) 60 MG capsule Take 60 mg by mouth 2 (two) times daily.     [provider]  ELIQUIS 2.5 MG TABS tablet Take 2.5 mg by mouth 2 (two) times daily. 03/23/23   [provider]  fluticasone (FLONASE) 50 MCG/ACT nasal spray Place 2 sprays into both nostrils daily. prn 11/10/22 11/10/23  [provider]  fluticasone (FLOVENT  HFA) 44 MCG/ACT inhaler Inhale 2 puffs into the lungs 2 (two) times daily.    [provider]  furosemide (LASIX) 40 MG tablet Take 1 tablet (40 mg total) by mouth daily. 07/29/21   Enedina Finner, MD  losartan (COZAAR) 50 MG tablet Take 50 mg by mouth daily.    [provider]  magnesium oxide (MAG-OX) 400 MG tablet Take 400 mg by mouth every other day. 07/27/10   [provider]  metFORMIN (GLUCOPHAGE-XR) 500 MG 24 hr tablet Take 500 mg by mouth daily.    [provider]  metoCLOPramide (REGLAN) 10 MG tablet Take 1 tablet (10 mg total) by mouth every 6 (six) hours as needed for up to 7 days for nausea or vomiting. 05/07/23 05/14/23  Charise Killian, MD  metoprolol tartrate 37.5 MG TABS Take 37.5 mg by mouth 2 (two) times daily. 07/28/21   Enedina Finner, MD  omeprazole (PRILOSEC) 20 MG capsule Take 20 mg by mouth daily. 08/31/15   [provider]  ondansetron (ZOFRAN) 4 MG tablet Take 4 mg by mouth every 8 (eight) hours as needed for nausea or vomiting. 04/19/23   [provider]  OZEMPIC, 2 MG/DOSE, 8 MG/3ML SOPN Inject 2 mg into the skin once a week. 10/13/22   [provider]  pregabalin (LYRICA) 50 MG capsule Take by mouth. 12/28/21   [provider]  Rimegepant Sulfate (NURTEC) 75 MG TBDP Take 75 mg by mouth daily as needed.  (migraine). 01/04/23   [provider]  traZODone (DESYREL) 150 MG tablet Take 150 mg by mouth at bedtime.    [provider]  zonisamide (ZONEGRAN) 100 MG capsule Take 100 mg by mouth 2 (two) times daily.    [provider]    Physical Exam   Triage Vital Signs: ED Triage Vitals  Encounter Vitals Group     BP 05/22/23 1804 (!) 138/93     Systolic BP Percentile --      Diastolic BP Percentile --      Pulse Rate 05/22/23 1804 (!) 117     Resp 05/22/23 1804 19     Temp 05/22/23 1804 98 F (36.7 C)     Temp Source 05/22/23 2138 Oral     SpO2 05/22/23 1804 94 %     Weight  05/22/23 1803 (!) 326 lb (147.9 kg)     Height 05/22/23 1803 5\' 5"  (1.651 m)     Head Circumference --      Peak Flow --      Pain Score 05/22/23 1803 10     Pain Loc --      Pain Education --      Exclude from Growth Chart --     Most recent vital signs: Vitals:   05/23/23 0300 05/23/23 0330  BP:  122/81  Pulse: 85 89  Resp: 11 10  Temp:    SpO2: 98% 98%    CONSTITUTIONAL: Alert, responds appropriately to questions. Well-appearing; well-nourished HEAD: Normocephalic, atraumatic EYES: Conjunctivae clear, pupils appear equal, sclera nonicteric ENT: normal nose; moist mucous membranes NECK: Supple, normal ROM CARD: RRR; S1 and S2 appreciated RESP: Normal chest excursion without splinting or tachypnea; breath sounds clear and equal bilaterally; no wheezes, no rhonchi, no rales, no hypoxia or respiratory distress, speaking full sentences ABD/GI: Non-distended; soft, tender to palpation diffusely throughout the abdomen BACK: The back appears normal EXT: Normal ROM in all joints; no deformity noted, no edema SKIN: Normal color for age and race; warm; no rash on exposed skin NEURO: Moves all extremities equally, normal speech, reports numbness throughout her extremities and torso, no drift, no facial asymmetry PSYCH: The patient's mood and manner are appropriate.   ED Results / Procedures / Treatments   LABS: (all labs ordered are listed, but only abnormal results are displayed) Labs Reviewed  COMPREHENSIVE METABOLIC PANEL - Abnormal; Notable for the following components:      Result Value   Potassium 3.2 (*)    CO2 20 (*)    Glucose, Bld 107 (*)    Calcium 8.8 (*)    Albumin 3.2 (*)    Total Bilirubin 2.1 (*)    Anion gap 17 (*)    All other components within normal limits  CBC WITH DIFFERENTIAL/PLATELET - Abnormal; Notable for the following components:   Hemoglobin 15.1 (*)    All other components within normal limits  MAGNESIUM - Abnormal; Notable for the following  components:   Magnesium 1.6 (*)    All other components within normal limits  RESP PANEL BY RT-PCR (RSV, FLU A&B, COVID)  RVPGX2  LIPASE, BLOOD  URINALYSIS, ROUTINE W REFLEX MICROSCOPIC  CBC  BASIC METABOLIC PANEL  MAGNESIUM  CBG MONITORING, ED  TROPONIN I (HIGH SENSITIVITY)  TROPONIN I (HIGH SENSITIVITY)     EKG:  EKG Interpretation Date/Time:  Monday May 22 2023 18:12:24 EDT Ventricular Rate:  115 PR Interval:  156 QRS Duration:  78 QT Interval:  290 QTC Calculation: 401 R Axis:   134  Text Interpretation: Sinus tachycardia with occasional Premature ventricular complexes Right axis deviation Low voltage QRS Cannot rule out Anterior infarct (cited on or before 05-May-2023) Abnormal ECG When compared with ECG of 05-May-2023 07:52, Fusion complexes are no longer Present QRS axis Shifted right Nonspecific T wave abnormality, worse in Lateral leads Confirmed by Rochele Raring [phone]) on 05/23/2023 12:25:57 AM         RADIOLOGY: My personal review and interpretation of imaging: MRI brain shows no stroke.  CT of the abdomen pelvis shows no acute abnormality.  I have personally reviewed all radiology reports.   MR BRAIN WO CONTRAST Result Date: 05/23/2023 CLINICAL DATA:  Weakness, dizziness and abdominal pain EXAM: MRI HEAD WITHOUT CONTRAST TECHNIQUE: Multiplanar, multiecho pulse sequences of the brain and surrounding structures were obtained without intravenous contrast. COMPARISON:  07/11/2019 FINDINGS: Brain: No acute infarct, mass effect or extra-axial collection. No acute or chronic hemorrhage. There is multifocal hyperintense T2-weighted signal within the white matter. Parenchymal volume and CSF spaces are normal. Old right occipital infarct. The midline structures are normal. Vascular: Normal flow voids. Skull and upper cervical spine: Normal calvarium and skull base. Visualized upper cervical spine and soft tissues are normal. Sinuses/Orbits:No paranasal sinus fluid levels or  advanced mucosal thickening. No mastoid or middle ear effusion. Normal orbits. IMPRESSION:  1. No acute intracranial abnormality. 2. 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 Electronically Signed   By: Deatra Robinson M.D.   On: 05/23/2023 02:11   CT ABDOMEN PELVIS W CONTRAST Result Date: 05/22/2023 CLINICAL DATA:  Acute abdominal pain. EXAM: CT ABDOMEN AND PELVIS WITH CONTRAST TECHNIQUE: Multidetector CT imaging of the abdomen and pelvis was performed using the standard protocol following bolus administration of intravenous contrast. RADIATION DOSE REDUCTION: This exam was performed according to the departmental dose-optimization program which includes automated exposure control, adjustment of the mA and/or kV according to patient size and/or use of iterative reconstruction technique. CONTRAST:  OMNIPAQUE IOHEXOL 350 MG/ML SOLN COMPARISON:  CT abdomen and pelvis 05/05/2023 FINDINGS: Lower chest: No acute abnormality. Hepatobiliary: No focal liver abnormality is seen. Status post cholecystectomy. No biliary dilatation. Pancreas: Unremarkable. No pancreatic ductal dilatation or surrounding inflammatory changes. Spleen: Normal in size without focal abnormality. Adrenals/Urinary Tract: Small left peripelvic cyst is unchanged. Otherwise, the kidneys, adrenal glands and bladder are within normal limits. Stomach/Bowel: There are postsurgical changes in the stomach and small bowel. There is no bowel obstruction, pneumatosis, focal inflammation or free air. There is a small hiatal hernia. The appendix is within normal limits. Vascular/Lymphatic: No significant vascular findings are present. No enlarged abdominal or pelvic lymph nodes. Reproductive: Uterus is small in size.  Ovaries are nonenlarged. Other: No abdominal wall hernia or abnormality. No abdominopelvic ascites. Musculoskeletal: No acute or significant osseous findings. IMPRESSION: 1. No acute localizing process in the  abdomen or pelvis. 2. Small hiatal hernia. 3. Postsurgical changes in the stomach and small bowel. Electronically Signed   By: Darliss Cheney M.D.   On: 05/22/2023 22:40     PROCEDURES:  Critical Care performed: No    .1-3 Lead EKG Interpretation  Performed by: Illyria Sobocinski, Layla Maw, DO Authorized by: Suezette Lafave, Layla Maw, DO     Interpretation: normal     ECG rate:  85   ECG rate assessment: normal     Rhythm: sinus rhythm     Ectopy: none     Conduction: normal       IMPRESSION / MDM / ASSESSMENT AND PLAN / ED COURSE  I reviewed the triage vital signs and the nursing notes.    Patient here for vertigo, vomiting, falls.  The patient is on the cardiac monitor to evaluate for evidence of arrhythmia and/or significant heart rate changes.   DIFFERENTIAL DIAGNOSIS (includes but not limited to):   Peripheral vertigo, stroke, less likely intracranial hemorrhage, anemia, electrolyte derangement, dehydration, surgical complication, bowel obstruction, UTI   Patient's presentation is most consistent with acute presentation with potential threat to life or bodily function.   PLAN: Patient's workup initiated from triage.  Patient's labs appear hemoconcentrated with a hemoglobin of 15.  No leukocytosis.  Normal LFTs other than total bilirubin of 2.1 which she has had previously.  Potassium of 3.2, magnesium of 1.6.  Will replace both.  Troponin x 2 negative.  COVID, flu and RSV swabs pending.  CT of the abdomen pelvis reviewed and interpreted by myself and the radiologist and shows no acute abnormality.  Reviewed patient's recent surgical note from Dr. Lambert Mody on 05/18/2023.  She recommended IV fluids, IV folate and thiamine which we will give today.  Patient is requesting transfer to [facility] which I feel is reasonable that she can be cared for by her bariatric surgeon and have further workup per their recommendations.  As for her vertigo and numbness,  will obtain MRI of the brain as she does have risk  factors for stroke.  She would be outside of tPA window given symptoms ongoing intermittently for the past month and worse over the past few days.  She will get Ativan for sedation prior to imaging.   MEDICATIONS GIVEN IN ED: Medications  potassium chloride 10 mEq in 100 mL IVPB (10 mEq Intravenous New Bag/Given 05/23/23 0335)  magnesium sulfate IVPB 2 g 50 mL (2 g Intravenous New Bag/Given 05/23/23 0243)  0.9 %  sodium chloride infusion ( Intravenous New Bag/Given 05/23/23 0243)  ondansetron (ZOFRAN) injection 4 mg (has no administration in time range)  thiamine (VITAMIN B1) injection 100 mg (has no administration in time range)  folic acid injection 1 mg (has no administration in time range)  iohexol (OMNIPAQUE) 350 MG/ML injection 100 mL (100 mLs Intravenous Contrast Given 05/22/23 1929)  sodium chloride 0.9 % bolus 1,000 mL (0 mLs Intravenous Stopped 05/23/23 0206)  ondansetron (ZOFRAN) injection 4 mg (4 mg Intravenous Given 05/23/23 0105)  meclizine (ANTIVERT) tablet 50 mg (50 mg Oral Given 05/23/23 0106)  LORazepam (ATIVAN) injection 1 mg (1 mg Intravenous Given 05/23/23 0105)  thiamine (VITAMIN B1) injection 100 mg (100 mg Intravenous Given 05/23/23 0105)  folic acid injection 1 mg (1 mg Intravenous Given 05/23/23 0224)     ED COURSE: MRI of the brain reviewed and interpreted by myself and the radiologist and shows no acute abnormality.  COVID, flu and RSV testing negative.  Discussed with transfer center at [facility].  They consulted Dr. Phylliss Blakes on-call for Dr. Lambert Mody who agrees to accept patient to the wait list.  Transfer center states they may have a bed later today when they are discharges done.  Given we have 23 admission holds in our ED, will keep her here until she is transferred to [facility] unless something changes.  Patient updated with this plan.  Will continue IV hydration, IV vitamin replacement, Zofran as needed.  Will repeat lab work for the morning.   CONSULTS: Discussed with  transfer center at [facility].  They agree to add patient to their wait list for admission.  Accepting physician is Dr. Phylliss Blakes.  Appreciate their help.     OUTSIDE RECORDS REVIEWED: Reviewed last bariatric surgical note from Dr. Lambert Mody on 05/18/2023.       FINAL CLINICAL IMPRESSION(S) / ED DIAGNOSES   Final diagnoses:  Vertigo  Hypokalemia  Hypomagnesemia  Nausea and vomiting in adult  Generalized abdominal pain     Rx / DC Orders   ED Discharge Orders     None        Note:  This document was prepared using Dragon voice recognition software and may include unintentional dictation errors.   Lilyannah Zuelke, Layla Maw, DO 05/23/23 [phone]

## 2023-05-23 NOTE — ED Notes (Signed)
 Called to [facility] for pt update on transfer @635  am/Pt on wait list for a bed assignment/Accepting Dr Hortencia Pilar.

## 2023-05-23 NOTE — ED Notes (Signed)
 This RN gave report to Marva Panda Paramedic and performed bedside care handoff. Call light in reach, bed wheels locked, side rail raised, pt updated on plan of care. Rounding completed.

## 2023-05-23 NOTE — ED Notes (Signed)
 This RN introduced self to pt. Call light in reach, bed wheels locked, side rail raised, pt updated on plan of care. Rounding completed.

## 2023-05-23 NOTE — ED Notes (Signed)
 Called to [facility] per MD Ward/Rep Florentina Addison.

## 2023-05-23 NOTE — ED Provider Notes (Signed)
-----------------------------------------   9:08 PM on 05/23/2023 ----------------------------------------- [facility] still is unable to accept the patient as they do not have any beds available.  Patient remains on the wait list to be transferred.  However the patient has not been in the emergency department over 26 hours.  Patient is eating and drinking.  I reviewed the patient's lab work she does have signs of dehydration with an elevated anion gap she also is hypomagnesemic with 1.6 level has been repleted.  Troponin negative CBC reassuring.  Patient's MRI showed no acute finding.  However concern is given the patient's recent bariatric surgery she is at risk for anastomosis stricture which could lead to essentially obstructive symptoms.  I spoke to the hospitalist regarding local admission as the patient is unable to be transferred at this time and has been in the emergency department over 1 day.  They would like to proceed with a barium swallow exam to evaluate for obstructive process/stricture.  If positive then the patient would still need to be transferred to [facility] and they would not feel comfortable admitting.  If negative they would consider admitting to their service for ongoing management until a bed became available.  I believe this is a reasonable plan of care for the patient.  She is otherwise doing well.  I spoke to the patient multiple times during my shift tonight.  States she has no one that can help her at home and she is agreeable to the plan to continue waiting for a bed at [facility].   Minna Antis, MD 05/23/23 2110

## 2023-05-23 NOTE — ED Notes (Signed)
 The pt is a 52 yof lying supine in the bed sleeping.

## 2023-05-23 NOTE — Consult Note (Signed)
 Initial Consultation Note   Patient: Stacie Shah WGN:[id number] DOB: Nov 10, 1970 PCP: [facility] DOA: 05/23/2023 DOS: the patient was seen and examined on 05/23/2023 Primary service: No att. providers found  Referring physician: Dr. Lenard Lance  Reason for consult: Intractable nausea and vomiting  Assessment/Plan:  # Intractable Nausea and Vomiting # Dehydration # S/p biliopancreatic diversion with duodenal switch Patient has been experiencing intractable nausea and vomiting now for nearly 2 months after her duodenal switch.  Her surgeon has expressed concern that this may be a surgical complication.  Per their most recent office note, plan was to obtain an upper GI study, presumedly to evaluate for any obstruction at the anastomosis site.  Will go ahead and obtain that study here at [facility] while she awaits transfer.  If study appears normal, could consider admission to [facility] then for symptomatic management.  However if study is abnormal, she will need to continue waiting for transfer.  - Upper GI study ordered - Start Reglan for nausea as needed - Change IV fluids to lactated Ringer's.  Discontinue normal saline  # Hypokalemia # Hypomagnesemia  - S/p 20 mEq of potassium and 2 g of magnesium - Repeat BMP and magnesium pending - Continue to replete as needed  # Congestive heart disease Patient appears euvolemic at this time.  Previously on diuretics, however no longer taking.  - Continue holding home diuretics - Daily weights  # Paroxysmal atrial fibrillation Previously on Eliquis, however it appears to have fallen off of her medication list.  - Restart Eliquis 2.5 mg twice daily   # OSA - CPAP at bedtime  # Hypertension - Continue home medications  # Type 2 diabetes Last A1c of 4.9%.  No indication for SSI.  [facility] will continue to follow the patient. ------------------------------------------------------------------------------------  HPI: Stacie Shah is  a 53 y.o. female with past medical history of morbid obesity s/p laparoscopic biliopancreatic diversion with duodenal switch (03/22/2023) complicated by intractable N/V, hyperlipidemia, hypertension, non-insulin-dependent type 2 diabetes mellitus, childhood seizure disorder, atrial fibrillation not on AC, HFpEF, who presents to the ED 2/2 continued nausea and vomiting.   History obtained from both patient and per chart review.  Patient has been experiencing persistent intractable nausea and vomiting that has led to her regurgitating both her medications, fluids and foods since her surgery.  This led to a admission back in February at which time she was given Reglan, IV fluids, IV electrolytes and discharged back home. She had a follow-up with her surgeon on 05/18/2023 at which time there was concern that her symptoms may be due to a surgical complication with recommendations to complete upper GI barium swallow.  At that time, the surgeon noted that regurgitation is persistent but does not occur with each time she has any p.o. intake.  There are some things that she is able to tolerate, such as fruit punch.  Mrs. Debord states that sometimes she eats solid food, and then when she throws up, it is only phlegm or liquid.  At this time, Mrs. Janak states that her nausea and vomiting seems a little improved this afternoon.  She is not experiencing any pain at this time, however occasionally experiences bilateral lower quadrant cramping that seems similar to menstrual cramps but notes that she has had a hysterectomy.  ED Course:  On arrival to the ED, patient was hypertensive at 138/93 with heart rate of 117.  She was saturating at 94% on room air. Initial workup notable for unremarkable CBC,  potassium 3.2, bicarb 20, creatinine 0.75, anion gap 17, GFR above 60.  Magnesium low at 1.6.  Urinalysis with ketonuria, proteinuria and increased specific gravity.  CT of the abdomen was obtained with no acute abnormalities to  explain symptoms.  MRI of the brain negative for acute intracranial abnormalities.  COVID-19, influenza and RSV PCR negative.  Patient surgeon was consulted and recommended transfer to [facility] for further evaluation and treatment.  Unfortunately, she has been here for over 24 hours awaiting a bed, and so [facility] contacted for admission.  Review of Systems: As mentioned in the history of present illness. All other systems reviewed and are negative.  Past Medical History:  Diagnosis Date   Anxiety    Arrhythmia    atrial fibrillation   Arthritis    Asthma    CHF (congestive heart failure) (HCC)    Depression    Diabetes mellitus without complication (HCC)    GERD (gastroesophageal reflux disease)    Hypercholesteremia    Hypertension    Seizure (HCC)    as a child   Sleep apnea    Past Surgical History:  Procedure Laterality Date   ABDOMINAL SURGERY     Gastric Sleeve   CATARACT EXTRACTION W/PHACO Right 05/24/2019   Procedure: CATARACT EXTRACTION PHACO AND INTRAOCULAR LENS PLACEMENT (IOC) RIGHT DIABETIC;  Surgeon: Galen Manila, MD;  Location: [facility] ORS;  Service: Ophthalmology;  Laterality: Right;  [place] 00:23.6 CDE 1.88 Fluid Pack Lot # [id number]   CHOLECYSTECTOMY     FOOT SURGERY     cyst removal from heel   REPEAT CESAREAN SECTION     c-section x3   TYMPANOSTOMY     Social History:  reports that she has never smoked. She has never used smokeless tobacco. She reports current alcohol use. She reports that she does not use drugs.  Allergies  Allergen Reactions   Sulfa Antibiotics Rash    Family History  Problem Relation Age of Onset   Hypertension Mother    Hypertension Maternal Grandmother     Prior to Admission medications   Medication Sig Start Date End Date Taking? Authorizing Provider  AIRSUPRA 90-80 MCG/ACT AERO Inhale 2 Inhalations into the lungs 2 (two) times daily.   Yes [provider]  albuterol (PROVENTIL) (2.5 MG/3ML) 0.083% nebulizer solution Take 3 mLs  (2.5 mg total) by nebulization every 4 (four) hours as needed for up to 14 days for wheezing or shortness of breath. 08/08/22 05/23/23 Yes Ray, Danie Binder, MD  albuterol (PROVENTIL) (2.5 MG/3ML) 0.083% nebulizer solution Take 2.5 mg by nebulization every 6 (six) hours as needed for wheezing. 03/16/23  Yes [provider]  albuterol (VENTOLIN HFA) 108 (90 Base) MCG/ACT inhaler Inhale 2 puffs into the lungs every 4 (four) hours as needed for wheezing or shortness of breath.   Yes [provider]  atorvastatin (LIPITOR) 10 MG tablet Take 10 mg by mouth daily.   Yes [provider]  baclofen (LIORESAL) 10 MG tablet Take 5-10 mg by mouth 3 (three) times daily as needed.   Yes [provider]  DULoxetine (CYMBALTA) 60 MG capsule Take 60 mg by mouth 2 (two) times daily.    Yes [provider]  fluticasone (FLONASE) 50 MCG/ACT nasal spray Place 2 sprays into both nostrils daily. prn 11/10/22 11/10/23 Yes [provider]  fluticasone (FLOVENT HFA) 44 MCG/ACT inhaler Inhale 2 puffs into the lungs 2 (two) times daily.   Yes [provider]  metoCLOPramide (REGLAN) 10 MG tablet Take  1 tablet (10 mg total) by mouth every 6 (six) hours as needed for up to 7 days for nausea or vomiting. 05/07/23 05/23/23 Yes Charise Killian, MD  metoprolol tartrate 37.5 MG TABS Take 37.5 mg by mouth 2 (two) times daily. 07/28/21  Yes Enedina Finner, MD  omeprazole (PRILOSEC) 20 MG capsule Take 20 mg by mouth daily. 08/31/15  Yes [provider]  ondansetron (ZOFRAN) 4 MG tablet Take 4 mg by mouth every 8 (eight) hours as needed for nausea or vomiting. 04/19/23  Yes [provider]  pregabalin (LYRICA) 100 MG capsule Take 100 mg by mouth 3 (three) times daily. 03/31/23  Yes [provider]  promethazine (PHENERGAN) 25 MG tablet Take 25 mg by mouth every 8 (eight) hours as needed for nausea or vomiting. 04/19/23  Yes [provider]  Rimegepant Sulfate  (NURTEC) 75 MG TBDP Take 75 mg by mouth daily as needed.  (migraine). 01/04/23  Yes [provider]  traZODone (DESYREL) 150 MG tablet Take 150 mg by mouth at bedtime.   Yes [provider]  zonisamide (ZONEGRAN) 100 MG capsule Take 100 mg by mouth 2 (two) times daily.   Yes [provider]  furosemide (LASIX) 40 MG tablet Take 1 tablet (40 mg total) by mouth daily. Patient not taking: Reported on 05/23/2023 07/29/21   Enedina Finner, MD    Physical Exam: Vitals:   05/23/23 1400 05/23/23 1430 05/23/23 1932 05/23/23 2153  BP: (!) 143/88 (!) 141/87 (!) 154/82 (!) 129/112  Pulse: 81 80 81 85  Resp: 15 13 16    Temp:   97.8 F (36.6 C)   TempSrc:   Oral   SpO2: 95% 97% 98%   Weight:      Height:       Physical Exam Vitals and nursing note reviewed.  Constitutional:      General: She is not in acute distress.    Appearance: She is morbidly obese. She is not ill-appearing.  HENT:     Head: Normocephalic and atraumatic.     Mouth/Throat:     Mouth: Mucous membranes are moist.  Cardiovascular:     Rate and Rhythm: Normal rate and regular rhythm.     Heart sounds: No murmur heard. Pulmonary:     Effort: Pulmonary effort is normal. No respiratory distress.     Breath sounds: Normal breath sounds.  Abdominal:     General: Bowel sounds are normal. There is no distension.  Skin:    General: Skin is warm and dry.  Neurological:     General: No focal deficit present.     Mental Status: She is alert and oriented to person, place, and time.  Psychiatric:        Mood and Affect: Mood normal.        Behavior: Behavior normal.    Data Reviewed:  CBC with WBC of 7.4, hemoglobin of 15.1, MCV of 96, platelets of 230 CMP with sodium of 136, potassium 3.2, bicarb 20, glucose 107, BUN 18, Gran 0.75, anion gap 17, AST 25, ALT 16, total bilirubin 2.1 and GFR above 60 Troponin negative x 2 Magnesium 1.6 COVID-19, influenza and RSV PCR negative Urinalysis with ketonuria,  proteinuria and increased specific gravity  EKG personally reviewed.  Sinus rhythm with rate of 115.  No ischemic appearing ST or T wave changes.  Singular PVC noted.  Results are pending, will review when available.   Family Communication: No family at bedside Primary team communication: EDP  updated via telephone  Thank you very much for involving [place] in the care of your patient.  Author: Verdene Lennert, MD 05/23/2023 10:30 PM  For on call review [email or web address].

## 2023-05-23 NOTE — ED Notes (Signed)
 Patient ambulated to restroom via walker without incident.

## 2023-05-23 NOTE — ED Notes (Signed)
 CALLED  [facility]   PT  STILL ON  WAITLIST FOR  BED  INFORMED  DR  Fuller Plan MD

## 2023-05-24 ENCOUNTER — Emergency Department

## 2023-05-24 MED ORDER — MAGNESIUM SULFATE 2 GM/50ML IV SOLN
2.0000 g | Freq: Once | INTRAVENOUS | Status: AC
  Administered 2023-05-24: 2 g via INTRAVENOUS
  Filled 2023-05-24: qty 50

## 2023-05-24 MED ORDER — LACTATED RINGERS IV SOLN: INTRAVENOUS | Status: AC

## 2023-05-24 MED ORDER — POTASSIUM CHLORIDE 20 MEQ PO PACK
40.0000 meq | PACK | Freq: Once | ORAL | Status: AC
  Administered 2023-05-24: 40 meq via ORAL
  Filled 2023-05-24: qty 2

## 2023-05-24 NOTE — ED Notes (Signed)
 Patient transported to X-ray

## 2023-05-24 NOTE — ED Notes (Signed)
 Called  [facility]  transfer  center pt  still on  waitlist  for a  bed  informed  Dr Larinda Buttery  MD

## 2023-05-24 NOTE — ED Notes (Signed)
 This RN received report from [employer] and performed bedside care handoff. This RN introduced self to pt. Call light in reach, bed wheels locked, side rail raised, pt updated on plan of care. Rounding completed.

## 2023-05-24 NOTE — ED Notes (Signed)
 [facility] spoke with [facility] for update about the transfer /she said no available beds /Pt is still on waitlist for transfer at this time

## 2023-05-24 NOTE — ED Notes (Signed)
 Pt moved to rm 5 at this time.

## 2023-05-24 NOTE — ED Notes (Signed)
 Pt sleeping with eyes closed, even rise and fall of chest.bed in lowest locked position

## 2023-05-24 NOTE — ED Notes (Signed)
 Pt ambulated to bathroom with walker. Pt provided with wash clothes, towels, soap, mesh underwear, tooth brush and toothpaste. PT declined taking a full shower but did wash off while in the bathroom.

## 2023-05-24 NOTE — ED Notes (Signed)
 Spoke  with  [facility]  pt  still on  waitlist  for  a bed  informed  Dr Scotty Court  MD

## 2023-05-24 NOTE — Consult Note (Addendum)
 Brief Consultation Note    Patient's RN reached out that patient had imaging studies performed earlier and patient has not been updated on results or next steps.  RN notes that patient's nausea and vomiting is much improved and she has been able to tolerate p.o. intake today.  She also notes that patient was able to walk independently to the bathroom.  Patient continues to endorse nonspecific numbness in her leg.  Reviewed patient's chart. She had upper GI series earlier today without evidence of obstruction, stricture or contrast extravasation.  Contrast was noted to flow freely at both the gastro ileal and ileoileal anastomoses sites.  No repeat blood work has been obtained today.  Assessment and plan:   # Intractable Nausea and Vomiting # Dehydration # S/p biliopancreatic diversion with duodenal switch Discussed normal upper GI series with EDP, Dr. Larinda Buttery.  I expressed that given patient's improved symptoms, stabilizing electrolytes, and normal upper GI series, she may not require admission any longer including at [facility] or [facility].  Dr. Larinda Buttery notes that he will ask day team tomorrow to reach out to patient's bariatric surgeon for guidance on next steps.  - Recommend patient continue home Reglan if any additional nausea and vomiting should occur, given she is at high risk for postoperative gastroparesis  # Hypokalemia # Hypomagnesemia - Recheck magnesium and potassium in the a.m. and replete as needed  # Leg Numbness Per chart review, this has also been ongoing since her procedure back in January 2025. Normal brain MRI yesterday. Bariatric surgeon checked copper, ferritin, folate, vitamin A, thiamine, vitamin B12, vitamin D, zinc and vitamin D levels on March 6th.  All within normal limits with the exception of vitamin A which is very minimally low.  Would not expect it to be causing her symptoms.    - PT/OT - Outpatient follow-up.  No indication for urgent inpatient  workup    Author: Verdene Lennert, MD 05/24/2023 8:17 PM  For on call review [email or web address].

## 2023-05-24 NOTE — ED Notes (Signed)
 Called to [facility] for pt update @615am  /Per Rep Jackey Loge pt on waitlist for a bed assignment.

## 2023-05-24 NOTE — ED Notes (Signed)
 This RN gave report Smitty Knudsen and performed bedside care handoff.

## 2023-05-25 DIAGNOSIS — I5032 Chronic diastolic (congestive) heart failure: Secondary | ICD-10-CM

## 2023-05-25 DIAGNOSIS — E876 Hypokalemia: Secondary | ICD-10-CM | POA: Diagnosis not present

## 2023-05-25 DIAGNOSIS — Z6841 Body Mass Index (BMI) 40.0 and over, adult: Secondary | ICD-10-CM

## 2023-05-25 DIAGNOSIS — R112 Nausea with vomiting, unspecified: Secondary | ICD-10-CM | POA: Diagnosis not present

## 2023-05-25 DIAGNOSIS — E66813 Obesity, class 3: Secondary | ICD-10-CM

## 2023-05-25 LAB — BASIC METABOLIC PANEL
Anion gap: 6 (ref 5–15)
BUN: 18 mg/dL (ref 6–20)
CO2: 21 mmol/L — ABNORMAL LOW (ref 22–32)
Calcium: 7.1 mg/dL — ABNORMAL LOW (ref 8.9–10.3)
Chloride: 112 mmol/L — ABNORMAL HIGH (ref 98–111)
Creatinine, Ser: 0.67 mg/dL (ref 0.44–1.00)
GFR, Estimated: >60 mL/min (ref >60)
Glucose, Bld: 91 mg/dL (ref 70–99)
Potassium: 2.8 mmol/L — ABNORMAL LOW (ref 3.5–5.1)
Sodium: 139 mmol/L (ref 135–145)

## 2023-05-25 LAB — MAGNESIUM: Magnesium: 1.4 mg/dL — ABNORMAL LOW (ref 1.7–2.4)

## 2023-05-25 MED ORDER — POTASSIUM CHLORIDE 10 MEQ/100ML IV SOLN
10.0000 meq | INTRAVENOUS | Status: AC
  Administered 2023-05-25 (×4): 10 meq via INTRAVENOUS
  Filled 2023-05-25 (×4): qty 100

## 2023-05-25 MED ORDER — LACTATED RINGERS IV BOLUS
1000.0000 mL | Freq: Once | INTRAVENOUS | Status: AC
  Administered 2023-05-25: 1000 mL via INTRAVENOUS

## 2023-05-25 MED ORDER — ACETAMINOPHEN 500 MG PO TABS
1000.0000 mg | ORAL_TABLET | Freq: Four times a day (QID) | ORAL | Status: DC | PRN
  Administered 2023-05-25: 1000 mg via ORAL
  Filled 2023-05-25: qty 2

## 2023-05-25 NOTE — ED Notes (Signed)
 Patient eating lunch tray at this time

## 2023-05-25 NOTE — ED Provider Notes (Signed)
 Emergency department handoff note  Care of this patient was signed out to me at the end of the previous provider shift.  All pertinent patient information was conveyed and all questions were answered.  Patient pending potassium replacement and discharge planning.  I was approached by the hospitalist team who feels that patient is stable for discharge at this time as she is p.o. tolerant and feels that patient's neurologic symptoms are likely secondary to vitamin deficiency. TOC order placed for safe discharge home   Merwyn Katos, MD 05/25/23 [phone]

## 2023-05-25 NOTE — Evaluation (Signed)
 Physical Therapy Evaluation Patient Details Name: Stacie Shah MRN: [id number] DOB: [birth year] Today's Date: 05/25/2023  History of Present Illness  Stacie Shah is a 53 y.o. 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 Lambert Mody at [facility] who presents to the emergency department complaints of vertigo that she describes as a room spinning worse when she turns her head ongoing for the past month.  Clinical Impression  Patient resting on stretcher upon arrival to ED bay; alert and oriented, follows commands and agreeable to participation with session.  Endorses chronic pain to back, generalized soreness throughout (due to recent falls) and L knee pain in standing (FACES 4/10); does appear to improve with rest and repositioning. Bilat UE/LE strength and ROM grossly symmetrical and WFL; no focal weakness appreciated.  Does endorse generalized paresthesia mid-chest distally (light touch > deep pressure).   Currently requiring mod assist for bed mobility (to elevate bilat LEs into bed); close sup for sit/stand, standing balance, basic transfers and short-distance gait (25' x2) with RW.  Demonstrates fair step height/length, broad BOS with excessive sway bilat; decreased L hip/knee flexion in swing phase (does endorse history of spinal injury?). Denies dizziness/lightheadedness with initial transition to upright, but does endorse progressive weakness/lightheadedness with prolonged upright positioning (associated with BP 77/54; does recover to 103/64 with return to supine). RN/MD informed and aware  Would benefit from skilled PT to address above deficits and promote optimal return to PLOF.; recommend post-acute PT follow up as indicated by interdisciplinary care team.     Orthostatic VS for the past 24 hrs (Last 3 readings):  BP- Lying  Pulse- Lying BP- Sitting Pulse- Sitting BP- Standing at 0 minutes Pulse- Standing at 0 minutes BP- Standing at 3 minutes Pulse- Standing at 3 minutes  05/25/23 1518 100/74 70 103/89 78 114/49 88 (!) 77/54 82         If plan is discharge home, recommend the following: A little help with walking and/or transfers;A little help with bathing/dressing/bathroom   Can travel by private vehicle        Equipment Recommendations Rolling walker (2 wheels) (bariatric)  Recommendations for Other Services       Functional Status Assessment Patient has had a recent decline in their functional status and demonstrates the ability to make significant improvements in function in a reasonable and predictable amount of time.     Precautions / Restrictions Precautions Precautions: Fall Restrictions Weight Bearing Restrictions Per Provider Order: No      Mobility  Bed Mobility Overal bed mobility: Needs Assistance Bed Mobility: Supine to Sit, Sit to Supine     Supine to sit: Supervision Sit to supine: Mod assist   General bed mobility comments: for LE elevation into bed    Transfers Overall transfer level: Needs assistance Equipment used: Rolling walker (2 wheels) Transfers: Sit to/from Stand Sit to Stand: Contact guard assist                Ambulation/Gait Ambulation/Gait assistance: Contact guard assist, Supervision Gait Distance (Feet):  (25' x2) Assistive device: Rolling walker (2 wheels)         General Gait Details: fair step height/length, broad BOS with excessive sway bilat; decreased L hip/knee flexion in swing phase (does endorse history of spinal injury?).  Denies dizziness/lightheadedness with initial transition to upright, but does endorse progressive weakness/lightheadedness with prolonged upright positioning (associated with BP  77/54; does recover to 103/64 with return to supine).  RN/MD informed and aware  Stairs            Wheelchair Mobility     Tilt  Bed    Modified Rankin (Stroke Patients Only)       Balance Overall balance assessment: Needs assistance Sitting-balance support: No upper extremity supported, Feet supported Sitting balance-Leahy Scale: Good     Standing balance support: Bilateral upper extremity supported Standing balance-Leahy Scale: Fair                               Pertinent Vitals/Pain Pain Assessment Pain Assessment: Faces Faces Pain Scale: Hurts little more Pain Location: L knee Pain Descriptors / Indicators: Aching, Discomfort Pain Intervention(s): Limited activity within patient's tolerance, Monitored during session, Repositioned    Home Living Family/patient expects to be discharged to:: Private residence Living Arrangements: Alone Available Help at Discharge: Family;Available PRN/intermittently Type of Home: House Home Access: Stairs to enter Entrance Stairs-Rails: None Entrance Stairs-Number of Steps: 3   Home Layout: One level Home Equipment: Shower seat - built in;Grab bars - tub/shower      Prior Function Prior Level of Function : Independent/Modified Independent;History of Falls (last six months)             Mobility Comments: reports 2 falls in the last week       Extremity/Trunk Assessment   Upper Extremity Assessment Upper Extremity Assessment: Overall WFL for tasks assessed    Lower Extremity Assessment Lower Extremity Assessment: Generalized weakness (grossly at least 4-/5 throughout; does endorse generalized paresthesia mid-trunk down through groin area and to bilat LEs)       Communication   Communication Communication: No apparent difficulties    Cognition Arousal: Alert Behavior During Therapy: WFL for tasks assessed/performed   PT - Cognitive impairments: No apparent impairments                                 Cueing       General Comments      Exercises Other Exercises Other Exercises: Toilet transfer, ambulatory with  RW, close sup; sit/stand from standard toilet, close sup   Assessment/Plan    PT Assessment Patient needs continued PT services  PT Problem List Decreased strength;Decreased activity tolerance;Decreased mobility;Decreased balance;Decreased coordination;Decreased safety awareness;Decreased knowledge of precautions;Cardiopulmonary status limiting activity;Impaired sensation       PT Treatment Interventions DME instruction;Gait training;Stair training;Functional mobility training;Therapeutic activities;Therapeutic exercise;Balance training;Patient/family education    PT Goals (Current goals can be found in the Care Plan section)  Acute Rehab PT Goals Patient Stated Goal: to figure out why my legs are numb and stop falling PT Goal Formulation: With patient Time For Goal Achievement: 06/08/23 Potential to Achieve Goals: Good    Frequency Min 2X/week     Co-evaluation               AM-PAC PT "6 Clicks" Mobility  Outcome Measure Help needed turning from your back to your side while in a flat bed without using bedrails?: None Help needed moving from lying on your back to sitting on the side of a flat bed without using bedrails?: A Lot Help needed moving to and from a bed to a chair (including a wheelchair)?: A Little Help needed standing up from a chair using your arms (e.g., wheelchair or bedside chair)?: A Little  Help needed to walk in hospital room?: A Little Help needed climbing 3-5 steps with a railing? : A Lot 6 Click Score: 17    End of Session Equipment Utilized During Treatment: Gait belt Activity Tolerance: Patient tolerated treatment well;Treatment limited secondary to medical complications (Comment) (symptomatic orthostasis) Patient left: in chair;with call bell/phone within reach Nurse Communication: Mobility status PT Visit Diagnosis: Muscle weakness (generalized) (M62.81);Difficulty in walking, not elsewhere classified (R26.2)    Time: 2440-1027 PT Time  Calculation (min) (ACUTE ONLY): 33 min   Charges:   PT Evaluation $PT Eval Moderate Complexity: 1 Mod PT Treatments $Therapeutic Activity: 8-22 mins PT General Charges $$ ACUTE PT VISIT: 1 Visit         Ventura Hollenbeck H. Manson Passey, PT, DPT, NCS 05/25/23, 3:30 PM [phone]

## 2023-05-25 NOTE — ED Provider Notes (Signed)
 [facility] Observation Note   ----------------------------------------- 2:08 AM on 05/25/2023 -----------------------------------------  Stacie Shah is a 54 y.o. female currently in the emergency department awaiting transport/transfer.  No acute events since last update.  Continue to wait for [facility] bed.  Recent Vitals   Most recent vital signs: Vitals:   05/24/23 2309 05/25/23 0143  BP:  101/65  Pulse:  74  Resp:  18  Temp: 98.1 F (36.7 C)   SpO2:  95%    ED Results / Procedures / Treatments   Labs (all labs ordered are listed, but only abnormal results are displayed) Labs Reviewed  COMPREHENSIVE METABOLIC PANEL - Abnormal; Notable for the following components:      Result Value   Potassium 3.2 (*)    CO2 20 (*)    Glucose, Bld 107 (*)    Calcium 8.8 (*)    Albumin 3.2 (*)    Total Bilirubin 2.1 (*)    Anion gap 17 (*)    All other components within normal limits  CBC WITH DIFFERENTIAL/PLATELET - Abnormal; Notable for the following components:   Hemoglobin 15.1 (*)    All other components within normal limits  URINALYSIS, ROUTINE W REFLEX MICROSCOPIC - Abnormal; Notable for the following components:   Color, Urine AMBER (*)    APPearance CLEAR (*)    Specific Gravity, Urine >1.046 (*)    Bilirubin Urine SMALL (*)    Ketones, ur 80 (*)    Protein, ur 30 (*)    All other components within normal limits  MAGNESIUM - Abnormal; Notable for the following components:   Magnesium 1.6 (*)    All other components within normal limits  BASIC METABOLIC PANEL - Abnormal; Notable for the following components:   Potassium 3.4 (*)    Glucose, Bld 103 (*)    Calcium 8.2 (*)    All other components within normal limits  RESP PANEL BY RT-PCR (RSV, FLU A&B, COVID)  RVPGX2  LIPASE, BLOOD  CBC  MAGNESIUM  BASIC METABOLIC PANEL  MAGNESIUM  CBG MONITORING, ED  TROPONIN I (HIGH SENSITIVITY)  TROPONIN I (HIGH SENSITIVITY)    MEDICATIONS  ORDERED IN ED: Medications  thiamine (VITAMIN B1) injection 100 mg (100 mg Intravenous Given 05/24/23 1024)  pantoprazole (PROTONIX) injection 40 mg (40 mg Intravenous Given 05/24/23 0401)  baclofen (LIORESAL) tablet 10 mg (10 mg Oral Given 05/24/23 2132)  zonisamide (ZONEGRAN) capsule 100 mg (100 mg Oral Given 05/24/23 1025)  pregabalin (LYRICA) capsule 100 mg (100 mg Oral Given 05/24/23 2132)  metoprolol tartrate (LOPRESSOR) tablet 37.5 mg (37.5 mg Oral Given 05/24/23 2132)  traZODone (DESYREL) tablet 150 mg (150 mg Oral Given 05/24/23 2133)  atorvastatin (LIPITOR) tablet 10 mg (10 mg Oral Given 05/24/23 1025)  DULoxetine (CYMBALTA) DR capsule 60 mg (60 mg Oral Given 05/24/23 2133)  folic acid injection 1 mg (1 mg Intravenous Given 05/24/23 1135)  lactated ringers infusion (0 mLs Intravenous Stopped 05/24/23 1118)  metoCLOPramide (REGLAN) injection 5 mg (has no administration in time range)  lactated ringers infusion (0 mLs Intravenous Stopped 05/25/23 0143)  iohexol (OMNIPAQUE) 350 MG/ML injection 100 mL (100 mLs Intravenous Contrast Given 05/22/23 1929)  sodium chloride 0.9 % bolus 1,000 mL (0 mLs Intravenous Stopped 05/23/23 0206)  ondansetron (ZOFRAN) injection 4 mg (4 mg Intravenous Given 05/23/23 0105)  meclizine (ANTIVERT) tablet 50 mg (50 mg Oral Given 05/23/23 0106)  potassium chloride 10 mEq in 100 mL IVPB (0 mEq Intravenous Stopped 05/23/23 0528)  LORazepam (ATIVAN) injection 1 mg (1 mg Intravenous Given 05/23/23 0105)  thiamine (VITAMIN B1) injection 100 mg (100 mg Intravenous Given 05/23/23 0105)  folic acid injection 1 mg (1 mg Intravenous Given 05/23/23 0224)  magnesium sulfate IVPB 2 g 50 mL (0 g Intravenous Stopped 05/23/23 0434)  HYDROcodone-acetaminophen (NORCO/VICODIN) 5-325 MG per tablet 1 tablet (1 tablet Oral Given 05/23/23 2154)  magnesium sulfate IVPB 2 g 50 mL (0 g Intravenous Stopped 05/24/23 0359)  potassium chloride (KLOR-CON) packet 40 mEq (40 mEq Oral Given 05/24/23 0152)      ED Plan   Currently awaiting transfer to [facility]  No acute issues.    Minna Antis, MD 05/25/23 [phone]

## 2023-05-25 NOTE — ED Notes (Signed)
 Spoke with transfer center regarding patient status. Pt still waiting on bed placement.

## 2023-05-25 NOTE — ED Notes (Signed)
 Pt ambulates to BR w/ steady gait and walker to urinate. Pt transferred to room from hallway for comfort.
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
## 2023-05-25 NOTE — ED Notes (Signed)
 Called [facility] @ 8:00 a.m. for follow up, spoke with rep. Tracey rep. stated they are on code census and she still has no bed assignment for the pt.

## 2023-05-25 NOTE — Progress Notes (Signed)
 Progress Note   Patient: Stacie Shah ZDG:[id number] DOB: 06-04-1970 DOA: 05/23/2023     0 DOS: the patient was seen and examined on 05/25/2023   Brief hospital course: Stacie Shah is a 53 y.o. female with past medical history of morbid obesity s/p laparoscopic biliopancreatic diversion with duodenal switch (03/22/2023) complicated by intractable N/V, hyperlipidemia, hypertension, non-insulin-dependent type 2 diabetes mellitus, childhood seizure disorder, atrial fibrillation not on AC, HFpEF, who presents to the ED 2/2 continued nausea and vomiting.   Hospitalist consulted for inpatient admission.  Since last 2 days her nausea and vomiting has improved and she is able to tolerate diet.  Patient's electrolytes monitored and replaced accordingly.  She does have tingling numbness of her lower extremities from below abdomen.  She feels weak and not confident to ambulate.  Patient can be discharged with PT OT recommendations, TOC to work on discharge needs.  I discussed with the ED physician regarding the same.  Assessment and Plan: Intractable nausea and vomiting Dehydration Status post biliopancreatic diversion with duodenal switch Patient had normal upper GI series. She is able to tolerate diet, nausea and vomiting improved. Patient will be continued on IV antiemetics, home Reglan therapy.  Hypokalemia Hypomagnesemia Continue IV repletion and recheck electrolytes.  Tingling and numbness sensation This has been ongoing issue since her procedure back in 01/01/2024. Bariatric surgeon advised her to continue to take multivitamins. Outpatient PCP follow-up for further recommendations.  Morbid obesity BMI 54.25 Continue to follow bariatric surgeon.  I advised PT OT evaluation for safe discharge planning. TOC consulted for discharge.      Out of bed to chair. Incentive spirometry. Nursing supportive care. Fall, aspiration precautions. DVT prophylaxis   Code Status:  Prior  Subjective: Patient is seen and examined today morning.  She has been experiencing tingling numbness from up her belly down to her legs.  Feels weakness of her legs, off balance.  Patient's surgeon advised her to take multivitamins which she is unable to tolerate due to nausea.  Today she is able to tolerate diet, advised to resume her oral vitamins.  Physical Exam: Vitals:   05/25/23 1135 05/25/23 1230 05/25/23 1400 05/25/23 1401  BP: 103/65 100/64 121/66   Pulse: 71 70 68   Resp: 16 14 12    Temp:    97.6 F (36.4 C)  TempSrc:    Oral  SpO2: 99% 100% 100%   Weight:      Height:        General - Middle aged morbidly obese female, no apparent distress HEENT - PERRLA, EOMI, atraumatic head, non tender sinuses. Lung -distant breath sounds. Heart - S1, S2 heard, no murmurs, rubs, trace pedal edema. Abdomen - Soft, non tender, obese, bowel sounds good Neuro - Alert, awake and oriented x 3, non focal exam. Skin - Warm and dry.  Data Reviewed:      Latest Ref Rng & Units 05/23/2023   11:10 PM 05/22/2023    6:03 PM 05/07/2023    4:07 AM  CBC  WBC 4.0 - 10.5 K/uL 7.0  7.4  4.8   Hemoglobin 12.0 - 15.0 g/dL 59.5  63.8  75.6   Hematocrit 36.0 - 46.0 % 40.7  44.4  38.2   Platelets 150 - 400 K/uL 220  230  127       Latest Ref Rng & Units 05/25/2023    6:41 AM 05/23/2023   11:10 PM 05/22/2023    6:03 PM  BMP  Glucose 70 - 99  mg/dL 91  161  096   BUN 6 - 20 mg/dL 18  19  18    Creatinine 0.44 - 1.00 mg/dL 0.45  4.09  8.11   Sodium 135 - 145 mmol/L 139  136  136   Potassium 3.5 - 5.1 mmol/L 2.8  3.4  3.2   Chloride 98 - 111 mmol/L 112  106  99   CO2 22 - 32 mmol/L 21  22  20    Calcium 8.9 - 10.3 mg/dL 7.1  8.2  8.8    DG Abd Portable 1V Result Date: 05/24/2023 CLINICAL DATA:  Bowel obstruction EXAM: PORTABLE ABDOMEN - 1 VIEW COMPARISON:  05/24/2023, CT 05/22/2023 FINDINGS: Enteral contrast within right lower quadrant small bowel. No contrast within the colon. Nonobstructed  gas pattern. Postsurgical changes in the epigastric area IMPRESSION: Enteral contrast within right lower quadrant small bowel. No contrast within the colon. Overall gas pattern is unobstructed Electronically Signed   By: Jasmine Pang M.D.   On: 05/24/2023 16:13   DG UGI W SINGLE CM (SOL OR THIN BA) Result Date: 05/24/2023 CLINICAL DATA:  Patient with history of morbid obesity s/p biliopancreatic diversion [facility] who presented to the ED on 05/22/23 with complaints of nausea and vomiting. Request for UGI to assess for obstruction or stricture at the anastomosis. Upon discussion with patient in the exam room she reports tolerating oral intake currently but has not had a significant bowel movement since earlier this week. EXAM: DG UGI W SINGLE CM TECHNIQUE: Single contrast examination was then performed using thin liquid barium. This exam was performed by APP name, and was supervised and interpreted by Rad name. Exam limited due to patient's body habitus, inability to stand and inability to tolerate significant amount of contrast due to nausea. Exam performed entirely in supine AP, RPO, LPO with table tilted to 30 degrees. FLUOROSCOPY: Radiation Exposure Index (as provided by the fluoroscopic device): 91.50 mGy Kerma COMPARISON:  CT abd/pelvis 05/22/23, 05/05/23. FINDINGS: Esophagus:  Normal appearance on problem focused exam. Esophageal motility: Patient unable to lay prone for complete motility exam. Within the limitations of the study there are mild tertiary contractions suggestive of esophageal dysmotility. Gastroesophageal reflux: No spontaneous gastroesophageal reflux noted on limited exam. Stomach: Expected post surgical changes of gastric sleeve/biliopancreatic diversion, staple line in tact with no contrast extravasation noted. Tiny hiatal hernia. Gastric emptying: Within normal limits - contrast noted to flow freely through the gastroileal anastomosis. Duodenum: Not evaluated due  to history of duodenal switch. Ileum: Normal appearance without stricture or obstruction. Contrast noted to flow freely to the ileoileal anastomosis. Other: Food contents noted in the stomach and ileum IMPRESSION: Single contrast problem focused upper GI series without evidence of obstruction, stricture or contrast extravasation. KUB ordered for later today to assess for progression of contrast into the colon given patient's current complaints of constipation. Limited evaluation for esophageal motility on the current study. Within these limitations, tertiary contractions noted suggesting mild dysmotility. Electronically Signed   By: Emily Filbert M.D.   On: 05/24/2023 14:44    Discussed with patient, ED physician.   Disposition: Home with primary surgeon follow up  Planned Discharge Destination: Home with Home Health     Time spent: 38 minutes  Author: Marcelino Duster, MD 05/25/2023 2:06 PM Secure chat 7am to 7pm For on call review [email or web address].

## 2023-05-25 NOTE — Discharge Instructions (Addendum)
   Cleared by the hospitalist for discharge and you can return to the ER if you develop worsening symptoms or any other concerns

## 2023-05-25 NOTE — Evaluation (Signed)
 Occupational Therapy Evaluation Patient Details Name: Stacie Shah MRN: [id number] DOB: [birth year] Today's Date: 05/25/2023   History of Present Illness   Stacie Shah is a 53 y.o. 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 Lambert [facility] at [facility] who presents to the emergency department complaints of vertigo that she describes as a room spinning worse when she turns her head ongoing for the past month.    Clinical Impressions Ms Garinger was seen for OT evaluation this date. Prior to hospital admission, pt was IND. Pt lives alone in home c 3 STE. Pt currently requires CGA + RW for ADL t/f ~20 ft, pt self-limits distance citing L knee pain 5/10. IND seated grooming tasks. Pt would benefit from skilled OT to address noted impairments and functional limitations (see below for any additional details). Upon hospital discharge, recommend no OT follow up.   If plan is discharge home, recommend the following:   Help with stairs or ramp for entrance     Functional Status Assessment   Patient has had a recent decline in their functional status and demonstrates the ability to make significant improvements in function in a reasonable and predictable amount of time.     Equipment Recommendations   Other (comment) (RW)     Recommendations for Other Services         Precautions/Restrictions   Precautions Precautions: Fall Recall of Precautions/Restrictions: Intact Restrictions Weight Bearing Restrictions Per Provider Order: No     Mobility Bed Mobility Overal bed mobility: Modified Independent             General bed mobility comments: increased time    Transfers Overall transfer level: Needs assistance Equipment used: Rolling walker (2 wheels) Transfers: Sit to/from Stand Sit to Stand:  Supervision           General transfer comment: cues for RW use      Balance Overall balance assessment: Needs assistance Sitting-balance support: No upper extremity supported, Feet supported Sitting balance-Leahy Scale: Normal     Standing balance support: Bilateral upper extremity supported Standing balance-Leahy Scale: Fair                             ADL either performed or assessed with clinical judgement   ADL Overall ADL's : Needs assistance/impaired                                       General ADL Comments: CGA + RW for ADL t/f ~20 ft, pt self-limits distance citing L knee pain 5/10. IND seated grooming tasks      Pertinent Vitals/Pain Pain Assessment Pain Assessment: 0-10 Pain Score: 5  Pain Location: L knee Pain Descriptors / Indicators: Aching, Discomfort Pain Intervention(s): Limited activity within patient's tolerance, Repositioned     Extremity/Trunk Assessment Upper Extremity Assessment Upper Extremity Assessment: Overall WFL for tasks assessed   Lower Extremity Assessment Lower Extremity Assessment: Generalized weakness       Communication Communication Communication: No apparent difficulties   Cognition Arousal: Alert Behavior During Therapy: WFL for tasks assessed/performed Cognition: No apparent impairments  Following commands: Intact                  Home Living Family/patient expects to be discharged to:: Private residence Living Arrangements: Alone Available Help at Discharge: Family;Available PRN/intermittently Type of Home: House Home Access: Stairs to enter [employer] of Steps: 3 Entrance Stairs-Rails: None Home Layout: One level     Bathroom Shower/Tub: Walk-in shower         Home Equipment: Shower seat - built in;Grab bars - tub/shower          Prior Functioning/Environment Prior Level of Function : Independent/Modified  Independent;History of Falls (last six months)             Mobility Comments: reports 2 falls in the last week      OT Problem List: Decreased strength;Decreased range of motion;Decreased activity tolerance;Impaired balance (sitting and/or standing)   OT Treatment/Interventions: Self-care/ADL training;Therapeutic exercise;Energy conservation;DME and/or AE instruction;Therapeutic activities      OT Goals(Current goals can be found in the care plan section)   Acute Rehab OT Goals Patient Stated Goal: go home and shower OT Goal Formulation: With patient Time For Goal Achievement: 06/08/23 Potential to Achieve Goals: Good ADL Goals Pt Will Perform Lower Body Dressing: with modified independence;sit to/from stand;with adaptive equipment Pt Will Transfer to Toilet: with modified independence;ambulating;regular height toilet   OT Frequency:  Min 1X/week    Co-evaluation              AM-PAC OT "6 Clicks" Daily Activity     Outcome Measure Help from another person eating meals?: None Help from another person taking care of personal grooming?: None Help from another person toileting, which includes using toliet, bedpan, or urinal?: A Little Help from another person bathing (including washing, rinsing, drying)?: A Little Help from another person to put on and taking off regular upper body clothing?: None Help from another person to put on and taking off regular lower body clothing?: A Little 6 Click Score: 21   End of Session Equipment Utilized During Treatment: Rolling walker (2 wheels);Gait belt  Activity Tolerance: Patient tolerated treatment well Patient left: in bed;with call bell/phone within reach;with nursing/sitter in room  OT Visit Diagnosis: Other abnormalities of gait and mobility (R26.89);Repeated falls (R29.6)                Time: 1610-9604 OT Time Calculation (min): 18 min Charges:  OT General Charges $OT Visit: 1 Visit OT Evaluation $OT Eval Low  Complexity: 1 Low  Kathie Dike, M.S. OTR/L  05/25/23, 2:21 PM  ascom [phone]

## 2023-07-11 ENCOUNTER — Ambulatory Visit: Admitting: Physical Therapy

## 2023-07-14 ENCOUNTER — Ambulatory Visit: Admitting: Physical Therapy

## 2023-07-17 ENCOUNTER — Other Ambulatory Visit: Payer: Self-pay

## 2023-07-17 ENCOUNTER — Encounter: Payer: Self-pay | Admitting: Physical Therapy

## 2023-07-17 ENCOUNTER — Ambulatory Visit: Admitting: Physical Therapy

## 2023-07-17 DIAGNOSIS — R262 Difficulty in walking, not elsewhere classified: Secondary | ICD-10-CM | POA: Insufficient documentation

## 2023-07-17 DIAGNOSIS — M6281 Muscle weakness (generalized): Secondary | ICD-10-CM | POA: Diagnosis present

## 2023-07-17 DIAGNOSIS — R200 Anesthesia of skin: Secondary | ICD-10-CM | POA: Insufficient documentation

## 2023-07-17 DIAGNOSIS — R296 Repeated falls: Secondary | ICD-10-CM | POA: Diagnosis present

## 2023-07-17 DIAGNOSIS — R202 Paresthesia of skin: Secondary | ICD-10-CM | POA: Insufficient documentation

## 2023-07-17 DIAGNOSIS — R269 Unspecified abnormalities of gait and mobility: Secondary | ICD-10-CM | POA: Diagnosis present
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
## 2023-07-19 ENCOUNTER — Ambulatory Visit: Admitting: Physical Therapy

## 2023-07-24 ENCOUNTER — Ambulatory Visit
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
## 2023-07-27 ENCOUNTER — Ambulatory Visit

## 2023-07-31 ENCOUNTER — Ambulatory Visit

## 2023-08-01 ENCOUNTER — Telehealth: Payer: Self-pay

## 2023-08-01 NOTE — Telephone Encounter (Signed)
 Patient Name: Stacie Shah MRN: [id number] DOB:Sep 11, 1970, 53 y.o., female Today's Date: 08/01/2023  Pt contacted via telephone and author left voice mail informing of missed appointment and informed pt of future PT appointment date and time.        Murlene Army, PT 08/01/2023, 11:28 AM

## 2023-08-02 ENCOUNTER — Ambulatory Visit: Admitting: Physical Therapy

## 2023-08-08 ENCOUNTER — Ambulatory Visit

## 2023-08-14 ENCOUNTER — Ambulatory Visit

## 2023-08-21 ENCOUNTER — Ambulatory Visit

## 2023-08-28 ENCOUNTER — Ambulatory Visit

## 2023-08-30 ENCOUNTER — Other Ambulatory Visit: Payer: Self-pay | Admitting: Family Medicine

## 2023-08-30 DIAGNOSIS — Z1231 Encounter for screening mammogram for malignant neoplasm of breast: Secondary | ICD-10-CM

## 2023-09-04 ENCOUNTER — Ambulatory Visit

## 2023-09-11 ENCOUNTER — Ambulatory Visit

## 2023-09-18 ENCOUNTER — Ambulatory Visit

## 2023-09-25 ENCOUNTER — Ambulatory Visit

## 2023-10-02 ENCOUNTER — Ambulatory Visit

## 2023-10-09 ENCOUNTER — Ambulatory Visit

## 2023-10-16 ENCOUNTER — Ambulatory Visit

## 2023-10-23 ENCOUNTER — Ambulatory Visit

## 2023-10-30 ENCOUNTER — Ambulatory Visit

## 2023-11-06 ENCOUNTER — Ambulatory Visit

## 2023-11-14 ENCOUNTER — Ambulatory Visit

## 2023-11-20 ENCOUNTER — Ambulatory Visit

## 2023-11-27 ENCOUNTER — Ambulatory Visit

## 2023-12-04 ENCOUNTER — Ambulatory Visit

## 2023-12-11 ENCOUNTER — Ambulatory Visit

## 2023-12-18 ENCOUNTER — Ambulatory Visit

## 2023-12-25 ENCOUNTER — Ambulatory Visit

## 2024-01-23 ENCOUNTER — Other Ambulatory Visit: Payer: Self-pay | Admitting: Medical Genetics

## 2024-01-26 ENCOUNTER — Other Ambulatory Visit

## 2024-01-30 ENCOUNTER — Encounter

## 2024-02-02 ENCOUNTER — Other Ambulatory Visit

## 2024-02-14 ENCOUNTER — Other Ambulatory Visit: Payer: Self-pay | Admitting: Physician Assistant

## 2024-02-14 DIAGNOSIS — M542 Cervicalgia: Secondary | ICD-10-CM

## 2024-02-14 DIAGNOSIS — G629 Polyneuropathy, unspecified: Secondary | ICD-10-CM

## 2024-02-14 DIAGNOSIS — G4486 Cervicogenic headache: Secondary | ICD-10-CM

## 2024-02-23 ENCOUNTER — Ambulatory Visit
Admission: RE | Admit: 2024-02-23 | Discharge: 2024-02-23 | Disposition: A | Source: Ambulatory Visit | Attending: Physician Assistant | Admitting: Physician Assistant

## 2024-02-23 DIAGNOSIS — M542 Cervicalgia: Secondary | ICD-10-CM

## 2024-02-23 DIAGNOSIS — G629 Polyneuropathy, unspecified: Secondary | ICD-10-CM

## 2024-02-23 DIAGNOSIS — G4486 Cervicogenic headache: Secondary | ICD-10-CM

## 2024-03-26 ENCOUNTER — Other Ambulatory Visit: Payer: Self-pay | Admitting: Surgery

## 2024-03-26 DIAGNOSIS — S46012A Strain of muscle(s) and tendon(s) of the rotator cuff of left shoulder, initial encounter: Secondary | ICD-10-CM

## 2024-03-26 DIAGNOSIS — M7582 Other shoulder lesions, left shoulder: Secondary | ICD-10-CM

## 2024-04-04 ENCOUNTER — Ambulatory Visit
Admission: RE | Admit: 2024-04-04 | Discharge: 2024-04-04 | Disposition: A | Discharge status: Home / Self Care | Source: Ambulatory Visit | Attending: Surgery | Admitting: Surgery

## 2024-04-04 DIAGNOSIS — S46012A Strain of muscle(s) and tendon(s) of the rotator cuff of left shoulder, initial encounter: Secondary | ICD-10-CM | POA: Diagnosis present

## 2024-04-04 DIAGNOSIS — M7582 Other shoulder lesions, left shoulder: Secondary | ICD-10-CM | POA: Insufficient documentation

## 2024-05-08 IMAGING — CT CT ANGIO CHEST
2 of 7 series · 17 of 46 positions shown · IV contrast (APPLIED)
Comparison: 07/25/2021

CLINICAL DATA: Nonradiating chest pain and chest heaviness,
shortness of breath for several weeks

EXAM:
CT ANGIOGRAPHY CHEST WITH CONTRAST
TECHNIQUE: Multidetector CT imaging of the chest was performed using the
standard protocol during bolus administration of intravenous
contrast. Multiplanar CT image reconstructions and MIPs were
obtained to evaluate the vascular anatomy.

[Series 6: thins · axial · 0.98mm/px · z∈[-638,-333]mm · 14 of 492 slices shown]
[im 28/492  lung]
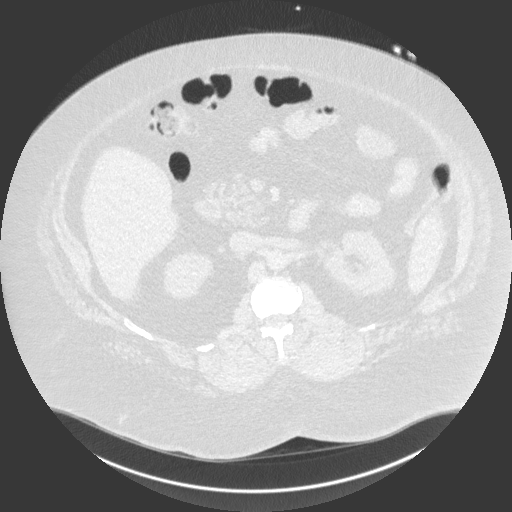
[im 55/492  soft-tissue]
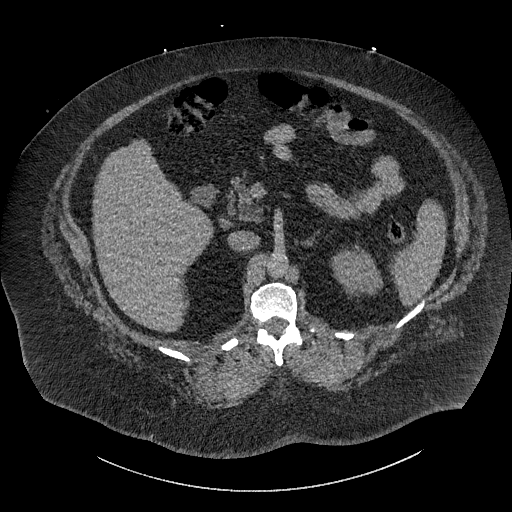
[im 110/492  lung]
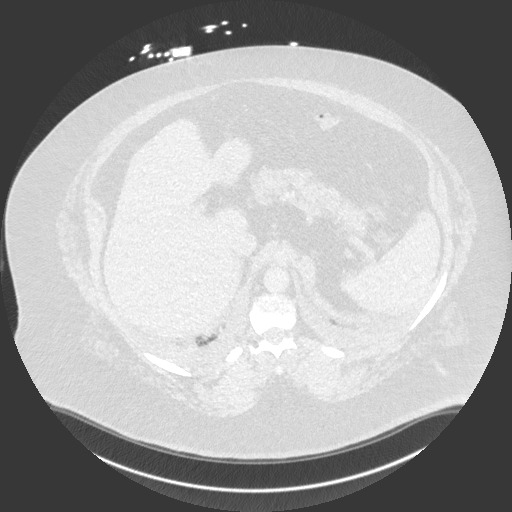
[im 137/492  soft-tissue]
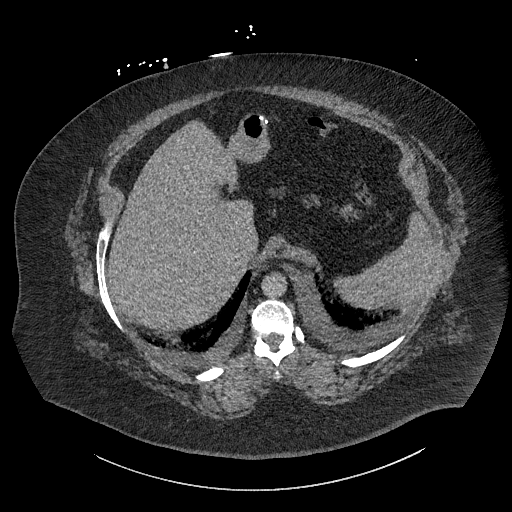
[im 164/492  lung]
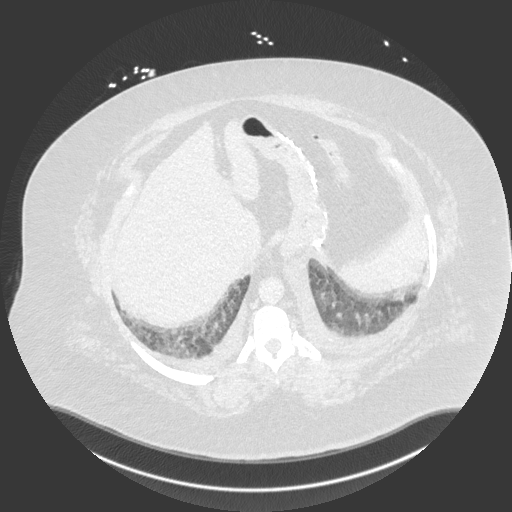
[im 191/492  soft-tissue]
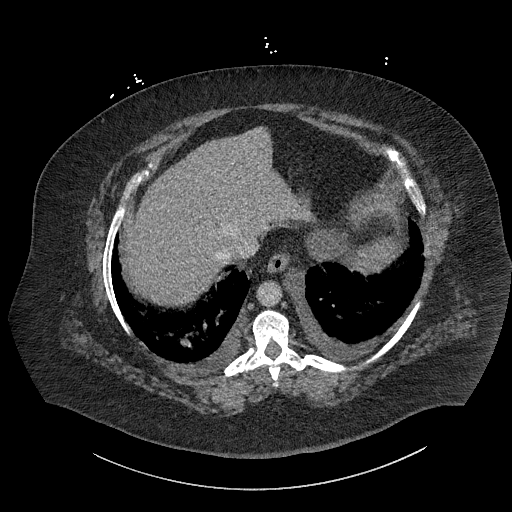
[im 219/492  lung]
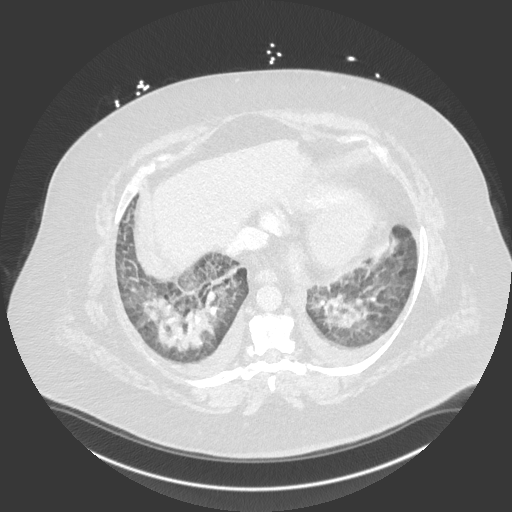
[im 273/492  soft-tissue]
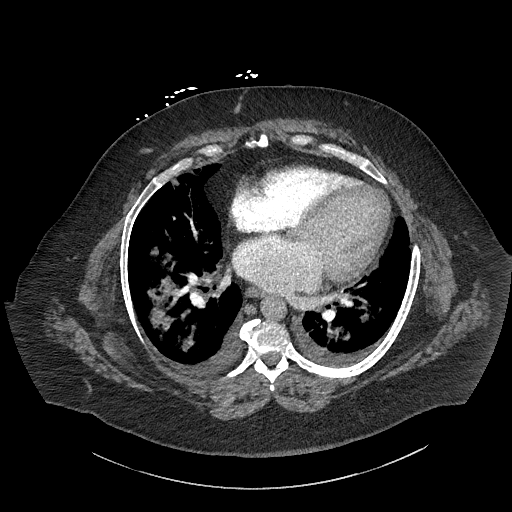
[im 301/492  lung]
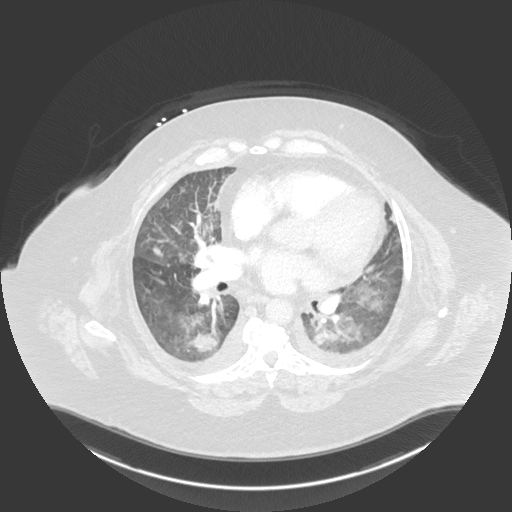
[im 328/492  soft-tissue]
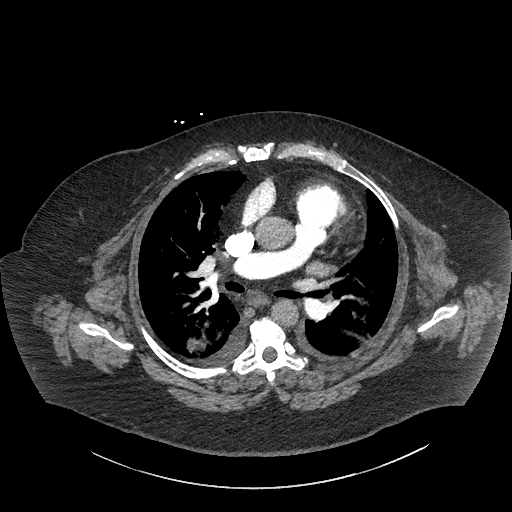
[im 355/492  lung]
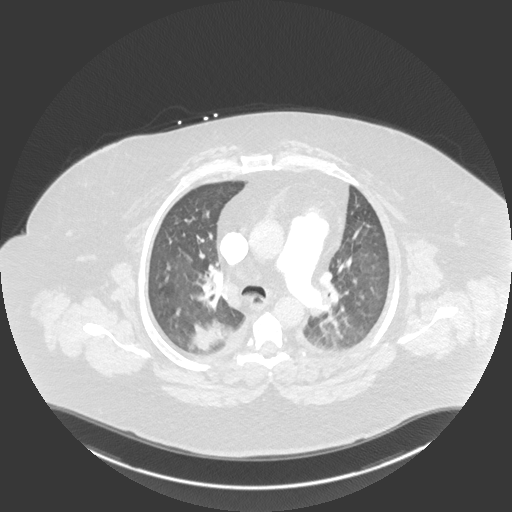
[im 382/492  soft-tissue]
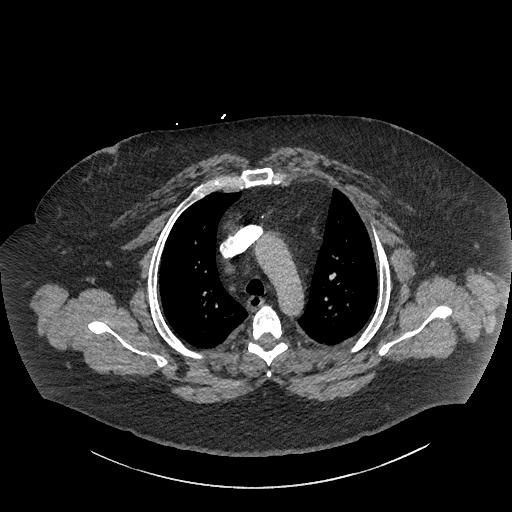
[im 437/492  lung]
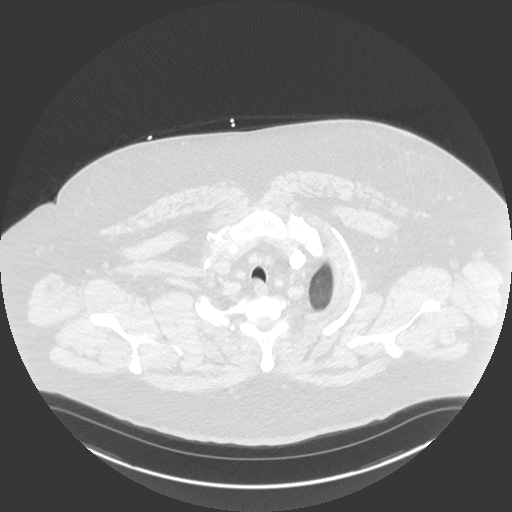
[im 464/492  soft-tissue]
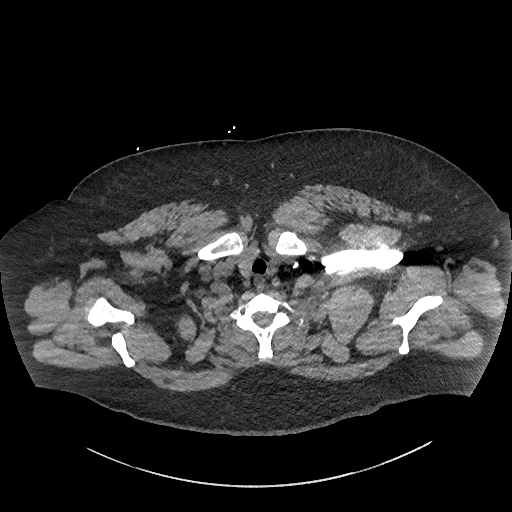

[Series 7: cor · coronal · 0.71mm/px · 3 of 208 slices shown]
[im 52/208  soft-tissue]
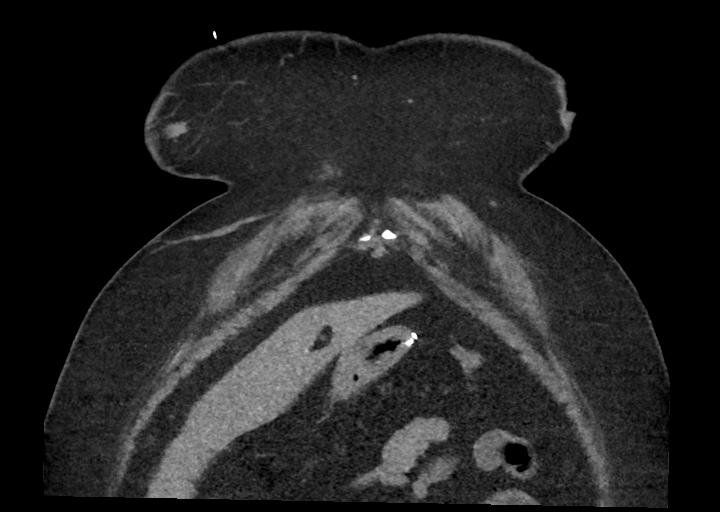
[im 104/208  soft-tissue]
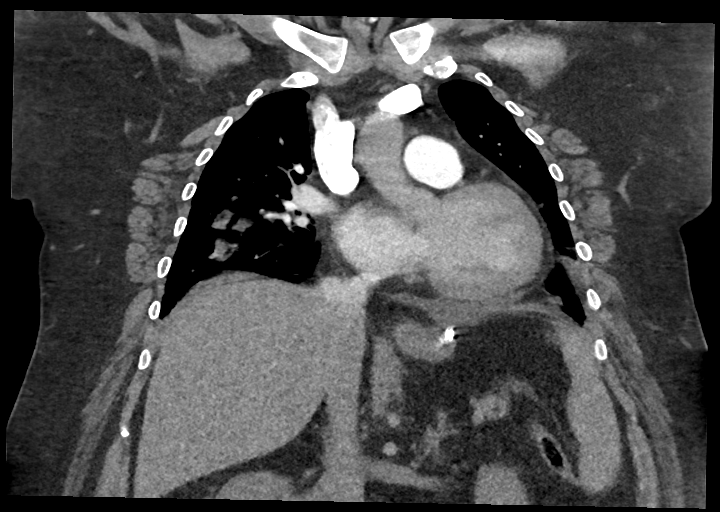
[im 156/208  soft-tissue]
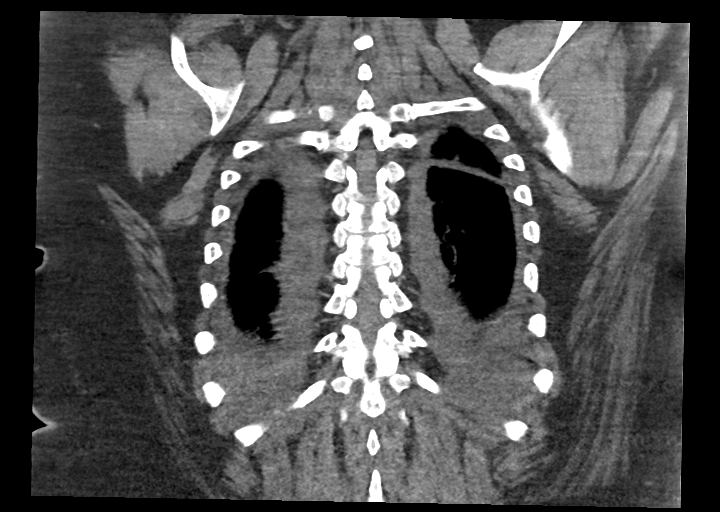

[17 of 46 positions shown; findings below may reference images not displayed]

RADIATION DOSE REDUCTION: This exam was performed according to the
departmental dose-optimization program which includes automated
exposure control, adjustment of the mA and/or kV according to
patient size and/or use of iterative reconstruction technique.

CONTRAST:  100mL OMNIPAQUE IOHEXOL 350 MG/ML SOLN
FINDINGS: Cardiovascular: This is a technically adequate evaluation of the
pulmonary vasculature. No filling defects or pulmonary emboli.

Heart is unremarkable without pericardial effusion. Normal caliber
of the thoracic aorta.

Mediastinum/Nodes: No enlarged mediastinal, hilar, or axillary lymph
nodes. Thyroid gland, trachea, and esophagus demonstrate no
significant findings.

Lungs/Pleura: Small bilateral pleural effusions. There is bilateral
perihilar airspace disease, most pronounced within the right middle
and bilateral lower lobes. Basilar predominant interlobular septal
thickening. Overall, findings favor congestive heart failure over
multifocal pneumonia. No pneumothorax. Central airways are patent.

Upper Abdomen: Postsurgical changes from cholecystectomy and
bariatric surgery. No acute upper abdominal findings.

Musculoskeletal: No acute or destructive bony lesions. Reconstructed
images demonstrate no additional findings.

Review of the MIP images confirms the above findings.
IMPRESSION: 1. No evidence of pulmonary embolus.
2. Bilateral airspace disease, basilar predominant septal
thickening, and bilateral pleural effusions most consistent with
congestive heart failure.
